# Patient Record
Sex: Female | Born: 1975 | Race: White | Hispanic: No | Smoking: Current every day smoker
Health system: Southern US, Community
[De-identification: ages and names within clinical notes are randomized; demographics above are authoritative.]

## PROBLEM LIST (undated history)

## (undated) DIAGNOSIS — I7300 Raynaud's syndrome without gangrene: Secondary | ICD-10-CM

## (undated) DIAGNOSIS — F32A Depression, unspecified: Secondary | ICD-10-CM

## (undated) DIAGNOSIS — I671 Cerebral aneurysm, nonruptured: Secondary | ICD-10-CM

## (undated) DIAGNOSIS — N3010 Interstitial cystitis (chronic) without hematuria: Secondary | ICD-10-CM

## (undated) DIAGNOSIS — M329 Systemic lupus erythematosus, unspecified: Secondary | ICD-10-CM

## (undated) DIAGNOSIS — F419 Anxiety disorder, unspecified: Secondary | ICD-10-CM

## (undated) DIAGNOSIS — F988 Other specified behavioral and emotional disorders with onset usually occurring in childhood and adolescence: Secondary | ICD-10-CM

## (undated) DIAGNOSIS — M549 Dorsalgia, unspecified: Secondary | ICD-10-CM

## (undated) DIAGNOSIS — G43909 Migraine, unspecified, not intractable, without status migrainosus: Secondary | ICD-10-CM

## (undated) DIAGNOSIS — IMO0002 Reserved for concepts with insufficient information to code with codable children: Secondary | ICD-10-CM

## (undated) DIAGNOSIS — J302 Other seasonal allergic rhinitis: Secondary | ICD-10-CM

## (undated) DIAGNOSIS — K219 Gastro-esophageal reflux disease without esophagitis: Secondary | ICD-10-CM

## (undated) DIAGNOSIS — M3500 Sicca syndrome, unspecified: Secondary | ICD-10-CM

## (undated) DIAGNOSIS — M1990 Unspecified osteoarthritis, unspecified site: Secondary | ICD-10-CM

## (undated) DIAGNOSIS — M797 Fibromyalgia: Secondary | ICD-10-CM

## (undated) DIAGNOSIS — F329 Major depressive disorder, single episode, unspecified: Secondary | ICD-10-CM

## (undated) DIAGNOSIS — I10 Essential (primary) hypertension: Secondary | ICD-10-CM

## (undated) DIAGNOSIS — N200 Calculus of kidney: Secondary | ICD-10-CM

## (undated) DIAGNOSIS — E162 Hypoglycemia, unspecified: Secondary | ICD-10-CM

## (undated) HISTORY — PX: CHOLECYSTECTOMY: SHX55

## (undated) HISTORY — DX: Anxiety disorder, unspecified: F41.9

## (undated) HISTORY — DX: Calculus of kidney: N20.0

## (undated) HISTORY — PX: TUBAL LIGATION: SHX77

## (undated) HISTORY — DX: Interstitial cystitis (chronic) without hematuria: N30.10

## (undated) HISTORY — DX: Other seasonal allergic rhinitis: J30.2

## (undated) HISTORY — DX: Reserved for concepts with insufficient information to code with codable children: IMO0002

## (undated) HISTORY — DX: Systemic lupus erythematosus, unspecified: M32.9

## (undated) HISTORY — DX: Gastro-esophageal reflux disease without esophagitis: K21.9

## (undated) HISTORY — DX: Major depressive disorder, single episode, unspecified: F32.9

## (undated) HISTORY — DX: Unspecified osteoarthritis, unspecified site: M19.90

## (undated) HISTORY — PX: TONSILLECTOMY: SUR1361

## (undated) HISTORY — DX: Cerebral aneurysm, nonruptured: I67.1

## (undated) HISTORY — DX: Depression, unspecified: F32.A

## (undated) HISTORY — DX: Raynaud's syndrome without gangrene: I73.00

## (undated) HISTORY — DX: Other specified behavioral and emotional disorders with onset usually occurring in childhood and adolescence: F98.8

## (undated) HISTORY — DX: Sjogren syndrome, unspecified: M35.00

## (undated) HISTORY — PX: APPENDECTOMY: SHX54

## (undated) HISTORY — DX: Fibromyalgia: M79.7

---

## 2011-11-08 ENCOUNTER — Encounter (HOSPITAL_BASED_OUTPATIENT_CLINIC_OR_DEPARTMENT_OTHER): Payer: Self-pay | Admitting: *Deleted

## 2011-11-08 ENCOUNTER — Emergency Department (HOSPITAL_BASED_OUTPATIENT_CLINIC_OR_DEPARTMENT_OTHER)
Admission: EM | Admit: 2011-11-08 | Discharge: 2011-11-09 | Disposition: A | Discharge status: Home / Self Care | Payer: BC Managed Care – PPO | Attending: Emergency Medicine | Admitting: Emergency Medicine

## 2011-11-08 DIAGNOSIS — R002 Palpitations: Secondary | ICD-10-CM

## 2011-11-08 DIAGNOSIS — G43909 Migraine, unspecified, not intractable, without status migrainosus: Secondary | ICD-10-CM | POA: Insufficient documentation

## 2011-11-08 DIAGNOSIS — R112 Nausea with vomiting, unspecified: Secondary | ICD-10-CM | POA: Insufficient documentation

## 2011-11-08 DIAGNOSIS — R51 Headache: Secondary | ICD-10-CM

## 2011-11-08 HISTORY — DX: Dorsalgia, unspecified: M54.9

## 2011-11-08 HISTORY — DX: Migraine, unspecified, not intractable, without status migrainosus: G43.909

## 2011-11-08 MED ORDER — ONDANSETRON 4 MG PO TBDP
4.0000 mg | ORAL_TABLET | Freq: Once | ORAL | Status: AC
  Administered 2011-11-08: 4 mg via ORAL
  Filled 2011-11-08: qty 1

## 2011-11-08 MED ORDER — HYDROMORPHONE HCL PF 2 MG/ML IJ SOLN
2.0000 mg | Freq: Once | INTRAMUSCULAR | Status: AC
  Administered 2011-11-08: 2 mg via INTRAMUSCULAR
  Filled 2011-11-08: qty 1

## 2011-11-08 MED ORDER — ONDANSETRON HCL 4 MG/2ML IJ SOLN
4.0000 mg | Freq: Once | INTRAMUSCULAR | Status: AC
  Administered 2011-11-08: 4 mg via INTRAMUSCULAR
  Filled 2011-11-08: qty 2

## 2011-11-08 MED ORDER — PROMETHAZINE HCL 25 MG PO TABS
25.0000 mg | ORAL_TABLET | Freq: Once | ORAL | Status: AC
  Administered 2011-11-08: 25 mg via ORAL
  Filled 2011-11-08: qty 1

## 2011-11-08 MED ORDER — KETOROLAC TROMETHAMINE 60 MG/2ML IM SOLN
60.0000 mg | Freq: Once | INTRAMUSCULAR | Status: AC
  Administered 2011-11-08: 60 mg via INTRAMUSCULAR
  Filled 2011-11-08: qty 2

## 2011-11-08 NOTE — ED Notes (Signed)
Pt reports sensation of heart palpitations again and pain in left leg that radiates downward with numbness in two of her toes. Pt is NSR on monitor without ectopy. Pt reassured or regular rhythm.

## 2011-11-08 NOTE — ED Notes (Signed)
Pt c/o palpitations x 1 week

## 2011-11-08 NOTE — ED Provider Notes (Signed)
Medical screening examination/treatment/procedure(s) were performed by non-physician practitioner and as supervising physician I was immediately available for consultation/collaboration.   Santiana Glidden B. Bernette Mayers, MD 11/08/11 2322

## 2011-11-08 NOTE — ED Provider Notes (Addendum)
History     CSN: [id number]  Arrival date & time 11/08/11  1933   First MD Initiated Contact with Patient 11/08/11 2018      Chief Complaint  Patient presents with  . Palpitations  . Headache  . Migraine    (Consider location/radiation/quality/duration/timing/severity/associated sxs/prior treatment) Patient is a 36 y.o. female presenting with palpitations. The history is provided by the patient. No language interpreter was used.  Palpitations  This is a new problem. The current episode started more than 1 week ago. The problem occurs constantly. The problem has been gradually worsening. The problem is associated with stress. Associated symptoms include headaches. She has tried nothing for the symptoms.   Pt reports she feels like her heart is skipping beats and beating irregularly.   Past Medical History  Diagnosis Date  . Migraine   . Back pain     Past Surgical History  Procedure Date  . Tonsillectomy   . Appendectomy   . Cholecystectomy   . Tubal ligation     History reviewed. No pertinent family history.  History  Substance Use Topics  . Smoking status: Never Smoker   . Smokeless tobacco: Not on file  . Alcohol Use: No    OB History    Grav Para Term Preterm Abortions TAB SAB Ect Mult Living                  Review of Systems  Cardiovascular: Positive for palpitations.  Neurological: Positive for headaches.  All other systems reviewed and are negative.    Allergies  Penicillins and Sulfa antibiotics  Home Medications  No current outpatient prescriptions on file.  BP 149/90  Pulse 84  Temp 98.3 F (36.8 C) (Oral)  Resp 16  Ht 5\' 1"  (1.549 m)  Wt 165 lb (74.844 kg)  BMI 31.18 kg/m2  SpO2 100%  LMP 10/31/2011  Physical Exam  Nursing note and vitals reviewed. Constitutional: She is oriented to person, place, and time. She appears well-developed and well-nourished.  HENT:  Head: Normocephalic and atraumatic.  Right Ear: External ear  normal.  Left Ear: External ear normal.  Nose: Nose normal.  Mouth/Throat: Oropharynx is clear and moist.  Eyes: Conjunctivae and EOM are normal. Pupils are equal, round, and reactive to light.  Neck: Normal range of motion. Neck supple.  Cardiovascular: Normal rate, regular rhythm and normal heart sounds.   Pulmonary/Chest: Effort normal.  Abdominal: Soft.  Musculoskeletal: Normal range of motion.  Neurological: She is alert and oriented to person, place, and time. She has normal reflexes.  Skin: Skin is warm.  Psychiatric: She has a normal mood and affect.    ED Course  Procedures (including critical care time)  Labs Reviewed - No data to display No results found.   No diagnosis found.  Date: 11/08/2011  Rate: 89  Rhythm: normal sinus rhythm  QRS Axis: normal  Intervals: normal  ST/T Wave abnormalities: normal  Conduction Disutrbances:none  Narrative Interpretation:   Old EKG Reviewed: none available Monitor no pvc's or pac's.     Pt given torodol with no relief.   Pt is driving.  I gave pt a shot of dilaudid and zofran.   Pt hyperventilating after injection.  Pt complains of numbness in left 4th and 5th toe.  Pt counseled on breathing and slowed down by EMT.    Pt reports she has had this numbness before.  I suspect symptoms due to hyperventilation.  Pt has continued nausea and vomiting.  Dr.  campos in to see and examine pt,   MDM  Pt advised to see her MD for recheck        Elson Areas, [place] 11/08/11 2321  Lonia Skinner [place], [place] 11/08/11 2323  Lonia Skinner [place], [place] 11/09/11 [phone]

## 2011-11-09 MED ORDER — DIAZEPAM 5 MG/ML IJ SOLN
5.0000 mg | Freq: Once | INTRAMUSCULAR | Status: AC
  Administered 2011-11-09: 5 mg via INTRAMUSCULAR
  Filled 2011-11-09: qty 2

## 2011-11-09 MED ORDER — ONDANSETRON HCL 4 MG/2ML IJ SOLN
4.0000 mg | Freq: Once | INTRAMUSCULAR | Status: AC
  Administered 2011-11-09: 4 mg via INTRAMUSCULAR
  Filled 2011-11-09: qty 2

## 2011-11-09 NOTE — ED Provider Notes (Signed)
Medical screening examination/treatment/procedure(s) were conducted as a shared visit with non-physician practitioner(s) and myself.  I personally evaluated the patient during the encounter  The patient has a normal neurologic exam.  She has pain medications and antinausea medicine at home.  She ambulated in the emergency apartment without difficulty.  I do not believe that she needs to have imaging of her head tonight.  She has nonspecific headache and vertiginous symptoms.  I do not believe this to be a stroke.  She has a neurologist who have asked her followup with.  Review of her telemetry strip demonstrates no ectopy or arrhythmia.  EKG normal sinus rhythm  Lyanne Co, MD 11/09/11 0120

## 2013-02-12 DIAGNOSIS — K529 Noninfective gastroenteritis and colitis, unspecified: Secondary | ICD-10-CM | POA: Insufficient documentation

## 2013-02-12 DIAGNOSIS — I10 Essential (primary) hypertension: Secondary | ICD-10-CM | POA: Insufficient documentation

## 2013-05-29 DIAGNOSIS — F339 Major depressive disorder, recurrent, unspecified: Secondary | ICD-10-CM | POA: Insufficient documentation

## 2014-02-26 HISTORY — PX: BRAIN SURGERY: SHX531

## 2014-03-15 DIAGNOSIS — I671 Cerebral aneurysm, nonruptured: Secondary | ICD-10-CM | POA: Insufficient documentation

## 2015-08-18 DIAGNOSIS — M329 Systemic lupus erythematosus, unspecified: Secondary | ICD-10-CM | POA: Insufficient documentation

## 2015-08-18 DIAGNOSIS — G894 Chronic pain syndrome: Secondary | ICD-10-CM | POA: Insufficient documentation

## 2015-08-18 DIAGNOSIS — M3500 Sicca syndrome, unspecified: Secondary | ICD-10-CM | POA: Insufficient documentation

## 2015-08-18 DIAGNOSIS — I7300 Raynaud's syndrome without gangrene: Secondary | ICD-10-CM | POA: Insufficient documentation

## 2015-11-19 DIAGNOSIS — R011 Cardiac murmur, unspecified: Secondary | ICD-10-CM

## 2015-11-19 DIAGNOSIS — M329 Systemic lupus erythematosus, unspecified: Secondary | ICD-10-CM

## 2015-11-19 DIAGNOSIS — E162 Hypoglycemia, unspecified: Secondary | ICD-10-CM

## 2015-11-19 DIAGNOSIS — I10 Essential (primary) hypertension: Secondary | ICD-10-CM

## 2015-11-19 NOTE — Congregational Nurse Program (Signed)
Congregational Nurse Program Note  Date of Encounter: 11/19/2015  Past Medical History: Past Medical History:  Diagnosis Date  . Back pain   . Migraine     Encounter Details:     CNP Questionnaire - 11/19/15 1743      Patient Demographics   Is this a new or existing patient? New   Patient is considered a/an Not Applicable   Race Caucasian/White     Patient Assistance   Location of Patient Assistance Not Applicable   Patient's financial/insurance status Medicaid   Uninsured Patient No   Patient referred to apply for the following financial assistance Not Applicable   Food insecurities addressed Not Applicable   Transportation assistance No   Assistance securing medications Yes   Type of Assistance Other   Educational health offerings Chronic disease;Navigating the healthcare system;Medications;Behavioral health;Hypertension;Interpersonal relationships     Encounter Details   Primary purpose of visit Education/Health Concerns;Spiritual Care/Support Visit;Navigating the Healthcare System;Chronic Illness/Condition Visit   Was an Emergency Department visit averted? Not Applicable   Does patient have a medical provider? No   Patient referred to Establish PCP;Area Agency   Was a mental health screening completed? (GAINS tool) No   Does patient have dental issues? Yes   Was a dental referral made? Yes   Does patient have vision issues? Yes   Was a vision referral made? Yes   Does your patient have an abnormal blood pressure today? Yes   Since previous encounter, have you referred patient for abnormal blood pressure that resulted in a new diagnosis or medication change? No   Does your patient have an abnormal blood glucose today? No   Since previous encounter, have you referred patient for abnormal blood glucose that resulted in a new diagnosis or medication change? No   Was there a life-saving intervention made? No     Initial visit  For this client [facility]  [facility]. States she has been off her blood pressure medicine  For some time now. ,since living here has no PCP , had a brain aneurysm  2015, heart murmur, hypoglycemic, Lupus,  Glaucoma, needs dental and eye exam, cyst on left ovary, menstrual cycle irregular , ,depression,  , headaches , migraines client  Nurse gave client a list of  PCP and dental , eye exam  Practices  To call for appointments . Client to  Follow up.,numbers given. Nurse will follow  up next week to see progress on making appointments.  Has been married  for  17 years , separated now  , husband living with his parents and their 40 year old son . If she gets her life together she states they will  Get back together as a family.Client  Also states she was once seen in a pain management clinic. Client has multiple  Health care concerns and needs. Follow up weekly to assure follow  through. Counseled regarding high blood  Pressure and need for  Follow  Up !!

## 2015-11-25 ENCOUNTER — Ambulatory Visit (HOSPITAL_COMMUNITY)
Admission: EM | Admit: 2015-11-25 | Discharge: 2015-11-25 | Disposition: A | Discharge status: Home / Self Care | Payer: Medicaid Other | Attending: Family Medicine | Admitting: Family Medicine

## 2015-11-25 ENCOUNTER — Encounter (HOSPITAL_COMMUNITY): Payer: Self-pay | Admitting: *Deleted

## 2015-11-25 DIAGNOSIS — Z5900 Homelessness unspecified: Secondary | ICD-10-CM

## 2015-11-25 DIAGNOSIS — I10 Essential (primary) hypertension: Secondary | ICD-10-CM

## 2015-11-25 DIAGNOSIS — J069 Acute upper respiratory infection, unspecified: Secondary | ICD-10-CM | POA: Diagnosis not present

## 2015-11-25 HISTORY — DX: Essential (primary) hypertension: I10

## 2015-11-25 MED ORDER — AZITHROMYCIN 250 MG PO TABS: ORAL_TABLET | ORAL | 0 refills | Status: DC

## 2015-11-25 MED ORDER — IPRATROPIUM BROMIDE 0.06 % NA SOLN: 2.0000 | Freq: Four times a day (QID) | NASAL | 1 refills | Status: DC

## 2015-11-25 MED ORDER — AMLODIPINE BESYLATE 10 MG PO TABS: 10.0000 mg | ORAL_TABLET | Freq: Every day | ORAL | 1 refills | Status: DC

## 2015-11-25 NOTE — Congregational Nurse Program (Signed)
Congregational Nurse Program Note  Date of Encounter: 11/25/2015  Past Medical History: Past Medical History:  Diagnosis Date  . Back pain   . Hypertension   . Migraine     Encounter Details:     CNP Questionnaire - 11/25/15 1813      Patient Demographics   Is this a new or existing patient? Existing   Patient is considered a/an Not Applicable   Race Caucasian/White     Patient Assistance   Location of Patient Assistance Not Applicable   Patient's financial/insurance status Medicaid   Uninsured Patient No   Patient referred to apply for the following financial assistance Not Applicable   Food insecurities addressed Not Applicable   Transportation assistance No   Assistance securing medications No   Educational health offerings Hypertension;Medications;Navigating the healthcare system;Behavioral health     Encounter Details   Primary purpose of visit Education/Health Concerns;Navigating the Healthcare System;Chronic Illness/Condition Visit;Spiritual Care/Support Visit   Was an Emergency Department visit averted? Not Applicable   Does patient have a medical provider? No   Patient referred to Establish PCP   Was a mental health screening completed? (GAINS tool) No   Does patient have dental issues? No   Was a dental referral made? No   Does patient have vision issues? Yes   Was a vision referral made? Yes   Does your patient have an abnormal blood pressure today? Yes   Since previous encounter, have you referred patient for abnormal blood pressure that resulted in a new diagnosis or medication change? Yes   Does your patient have an abnormal blood glucose today? No   Since previous encounter, have you referred patient for abnormal blood glucose that resulted in a new diagnosis or medication change? No   Was there a life-saving intervention made? No    Client in to see nurse  Somewhat  Frustrated  States  She  Went to  ED today  Because she  Needed her  Blood  Pressure  medication  . Client followed up with  Calling for  PCP home  But  Needs to have her  Medicaid  [place]  An appointment can be  Made . Became  Frustrated and  Went to  ED. Nurse called  [facility] to make  Sure that is the case and  Information was verified . [place]  [facility] [place]  And  Left a message for medicaid  Worked  To call nurse. Will try  To  Navigates  System for  Client. Client needs a PCP . Client is  Stressed and  Needs care States she is  Stressed because  She  doent  Feel good, grandmother passed  Away  Couldn't  Go  To the funeral , no  Family  members would  Come to pick her up. . Needs mental  Health  Counseling  Lots of relationship issues. Priority getting her a PCP , and  Mental health counseling.. Client was placed on amlodipine   10 mg , pressure came down some  From ED visit.  Will follow up  Om medicaid issue and follow  For medication compliance.

## 2015-11-25 NOTE — ED Provider Notes (Signed)
[facility]    CSN: [id number] Arrival date & time: 11/25/15  1018  First Provider Contact:  First MD Initiated Contact with Patient 11/25/15 1132        History   Chief Complaint Chief Complaint  Patient presents with  . URI    HPI Joy Nelson is a 40 y.o. female.    URI  Presenting symptoms: congestion, cough, rhinorrhea and sore throat   Presenting symptoms: no fever   Severity:  Mild Onset quality:  Gradual Duration:  2 weeks Chronicity:  Chronic Relieved by:  None tried Worsened by:  Nothing Ineffective treatments:  None tried Associated symptoms: headaches and sinus pain   Risk factors comment:  Hypertension   Past Medical History:  Diagnosis Date  . Back pain   . Hypertension   . Migraine     There are no active problems to display for this patient.   Past Surgical History:  Procedure Laterality Date  . APPENDECTOMY    . CHOLECYSTECTOMY    . TONSILLECTOMY    . TUBAL LIGATION      OB History    No data available       Home Medications    Prior to Admission medications   Medication Sig Start Date End Date Taking? Authorizing Provider  ALPRAZolam Prudy Feeler(XANAX) 1 MG tablet Take 1 mg by mouth 3 (three) times daily as needed. For anxiety.    Historical Provider, MD  amphetamine-dextroamphetamine (ADDERALL XR) 30 MG 24 hr capsule Take 30 mg by mouth every morning.    Historical Provider, MD  ARIPiprazole (ABILIFY) 5 MG tablet Take 5 mg by mouth daily.    Historical Provider, MD  Gabapentin, PHN, (GRALISE) 300 MG TABS Take 1 tablet by mouth daily.    Historical Provider, MD  hydroxychloroquine (PLAQUENIL) 200 MG tablet Take 200 mg by mouth daily.    Historical Provider, MD  meclizine (ANTIVERT) 25 MG tablet Take 25 mg by mouth 3 (three) times daily as needed. For dizziness.    Historical Provider, MD  methadone (DOLOPHINE) 10 MG tablet Take 10 mg by mouth 4 (four) times daily - after meals and at bedtime.    Historical Provider, MD    metoprolol (LOPRESSOR) 50 MG tablet Take 50 mg by mouth 2 (two) times daily.    Historical Provider, MD  mycophenolate (CELLCEPT) 500 MG tablet Take 500 mg by mouth 3 (three) times daily.    Historical Provider, MD  nortriptyline (PAMELOR) 25 MG capsule Take 25 mg by mouth at bedtime. Patient uses 75 milligrams daily.    Historical Provider, MD  oxyCODONE-acetaminophen (PERCOCET) 10-325 MG per tablet Take 1 tablet by mouth every 4 (four) hours as needed. For pain.    Historical Provider, MD  prednisoLONE 5 MG TABS Take 5 mg by mouth 2 (two) times daily.    Historical Provider, MD  tiZANidine (ZANAFLEX) 2 MG tablet Take 2 mg by mouth every 6 (six) hours as needed. For muscle spasms.    Historical Provider, MD  topiramate (TOPAMAX) 25 MG tablet Take 25 mg by mouth 2 (two) times daily.    Historical Provider, MD    Family History History reviewed. No pertinent family history.  Social History Social History  Substance Use Topics  . Smoking status: Never Smoker  . Smokeless tobacco: Not on file  . Alcohol use No     Allergies   Penicillins and Sulfa antibiotics   Review of Systems Review of Systems  Constitutional: Negative.  Negative  for fever.  HENT: Positive for congestion, rhinorrhea, sinus pressure and sore throat.   Eyes: Negative.   Respiratory: Positive for cough. Negative for shortness of breath.   Cardiovascular: Negative.   Gastrointestinal: Negative.   Neurological: Positive for headaches. Negative for dizziness and light-headedness.  All other systems reviewed and are negative.    Physical Exam Triage Vital Signs ED Triage Vitals [11/25/15 1136]  Enc Vitals Group     BP (!) 180/106     Pulse Rate 88     Resp 18     Temp 98.6 F (37 C)     Temp Source Oral     SpO2 99 %     Weight      Height      Head Circumference      Peak Flow      Pain Score      Pain Loc      Pain Edu?      Excl. in GC?    No data found.   Updated Vital Signs BP (!) 180/106  (BP Location: Left Arm)   Pulse 88   Temp 98.6 F (37 C) (Oral)   Resp 18   LMP 11/24/2015   SpO2 99%   Visual Acuity Right Eye Distance:   Left Eye Distance:   Bilateral Distance:    Right Eye Near:   Left Eye Near:    Bilateral Near:     Physical Exam  Constitutional: She is oriented to person, place, and time. She appears well-developed and well-nourished. No distress.  HENT:  Head: Normocephalic.  Right Ear: External ear normal.  Left Ear: External ear normal.  Nose: Nose normal.  Mouth/Throat: Oropharynx is clear and moist.  Eyes: Conjunctivae are normal. Pupils are equal, round, and reactive to light.  Neck: Normal range of motion. Neck supple.  Cardiovascular: Normal rate, regular rhythm, normal heart sounds and intact distal pulses.   Pulmonary/Chest: Effort normal and breath sounds normal.  Lymphadenopathy:    She has no cervical adenopathy.  Neurological: She is alert and oriented to person, place, and time.  Skin: Skin is warm and dry.  Nursing note and vitals reviewed.    UC Treatments / Results  Labs (all labs ordered are listed, but only abnormal results are displayed) Labs Reviewed - No data to display  EKG  EKG Interpretation None       Radiology No results found.  Procedures Procedures (including critical care time)  Medications Ordered in UC Medications - No data to display   Initial Impression / Assessment and Plan / UC Course  I have reviewed the triage vital signs and the nursing notes.  Pertinent labs & imaging results that were available during my care of the patient were reviewed by me and considered in my medical decision making (see chart for details).  Clinical Course      Final Clinical Impressions(s) / UC Diagnoses   Final diagnoses:  None    New Prescriptions New Prescriptions   No medications on file     Linna Hoff, MD 11/25/15 1147

## 2015-11-25 NOTE — ED Triage Notes (Signed)
Pt  Has   Symptoms  Of  Sinus  Congestion     r  Earache   bp  Is  Elevated    As   Well      Pt  Has  Been taking otc       meds   Pt  Has  No pcp in  [place]   She has  Been out of  Her meds   X  3  Weeks  She  Also has a  headache

## 2015-11-28 ENCOUNTER — Encounter (HOSPITAL_COMMUNITY): Payer: Self-pay | Admitting: Family Medicine

## 2015-11-28 ENCOUNTER — Ambulatory Visit (HOSPITAL_COMMUNITY)
Admission: EM | Admit: 2015-11-28 | Discharge: 2015-11-28 | Disposition: A | Discharge status: Home / Self Care | Payer: Medicaid Other | Attending: Internal Medicine | Admitting: Internal Medicine

## 2015-11-28 DIAGNOSIS — R600 Localized edema: Secondary | ICD-10-CM

## 2015-11-28 DIAGNOSIS — I10 Essential (primary) hypertension: Secondary | ICD-10-CM | POA: Diagnosis not present

## 2015-11-28 DIAGNOSIS — T887XXA Unspecified adverse effect of drug or medicament, initial encounter: Secondary | ICD-10-CM | POA: Diagnosis not present

## 2015-11-28 DIAGNOSIS — T50905A Adverse effect of unspecified drugs, medicaments and biological substances, initial encounter: Secondary | ICD-10-CM

## 2015-11-28 LAB — POCT I-STAT, CHEM 8
BUN: 12 mg/dL (ref 6–20)
Calcium, Ion: 1.13 mmol/L — ABNORMAL LOW (ref 1.15–1.40)
Chloride: 104 mmol/L (ref 101–111)
Creatinine, Ser: 0.5 mg/dL (ref 0.44–1.00)
Glucose, Bld: 85 mg/dL (ref 65–99)
HCT: 49 % — ABNORMAL HIGH (ref 36.0–46.0)
Hemoglobin: 16.7 g/dL — ABNORMAL HIGH (ref 12.0–15.0)
POTASSIUM: 3.4 mmol/L — AB (ref 3.5–5.1)
Sodium: 140 mmol/L (ref 135–145)
TCO2: 23 mmol/L (ref 0–100)

## 2015-11-28 MED ORDER — LISINOPRIL 10 MG PO TABS: 10.0000 mg | ORAL_TABLET | Freq: Every day | ORAL | 0 refills | Status: DC

## 2015-11-28 MED ORDER — CLOTRIMAZOLE 1 % EX CREA: TOPICAL_CREAM | CUTANEOUS | 0 refills | Status: DC

## 2015-11-28 NOTE — ED Triage Notes (Signed)
Pt here. sts that she doesn't feel right. sts she has been taking abx for sinus infection. sts the infection feels better. sts her BP medicine was switched last time she was here and has been having headaches, fatigue and BLE swelling.

## 2015-11-28 NOTE — ED Provider Notes (Signed)
CSN: [id number]     Arrival date & time 11/28/15  1051 History   None    Chief Complaint  Patient presents with  . Headache  . Leg Swelling   (Consider location/radiation/quality/duration/timing/severity/associated sxs/prior Treatment) Joy Nelson is a 40 yo female who carries a history of HTN. She presents with swelling in her lower extremities and not feeling well since initiation of the Norvasc 3 days ago. No dyspnea or chest pain is noted. No fevers. Sinus infection appears improved.       Past Medical History:  Diagnosis Date  . Back pain   . Hypertension   . Migraine    Past Surgical History:  Procedure Laterality Date  . APPENDECTOMY    . CHOLECYSTECTOMY    . TONSILLECTOMY    . TUBAL LIGATION     History reviewed. No pertinent family history. Social History  Substance Use Topics  . Smoking status: Never Smoker  . Smokeless tobacco: Never Used  . Alcohol use No   OB History    No data available     Review of Systems  Constitutional: Positive for fatigue. Negative for fever.  Eyes: Negative for photophobia, pain and visual disturbance.  Respiratory: Negative for cough and shortness of breath.   Cardiovascular: Positive for leg swelling. Negative for chest pain.  Gastrointestinal: Negative for nausea.  Skin: Negative.   Neurological: Positive for headaches. Negative for dizziness, weakness and numbness.  Psychiatric/Behavioral: Negative.     Allergies  Methotrexate derivatives; Other; Penicillins; and Sulfa antibiotics  Home Medications   Prior to Admission medications   Medication Sig Start Date End Date Taking? Authorizing Provider  azithromycin (ZITHROMAX Z-PAK) 250 MG tablet Take as directed on pack 11/25/15  Yes Linna Hoff, MD  ipratropium (ATROVENT) 0.06 % nasal spray Place 2 sprays into both nostrils 4 (four) times daily. 11/25/15  Yes Linna Hoff, MD  lisinopril (PRINIVIL,ZESTRIL) 10 MG tablet Take 1 tablet (10 mg total) by mouth daily. 11/28/15    Riki Sheer, PA-C   Meds Ordered and Administered this Visit  Medications - No data to display  BP 156/87 (BP Location: Left Arm)   Pulse 112   Temp 98.7 F (37.1 C) (Oral)   Resp 16   LMP 11/24/2015   SpO2 100%  No data found.   Physical Exam  Constitutional: She is oriented to person, place, and time. She appears well-developed and well-nourished. No distress.  HENT:  Head: Normocephalic and atraumatic.  Eyes: EOM are normal. Pupils are equal, round, and reactive to light.  Neck: Normal range of motion. Neck supple.  No JVD  Cardiovascular: Normal rate and regular rhythm.   Bilateral 2+ LE edema  Pulmonary/Chest: Effort normal and breath sounds normal.  Musculoskeletal: She exhibits edema.  Neurological: She is alert and oriented to person, place, and time.  Skin: Skin is warm and dry. She is not diaphoretic. No erythema.  Psychiatric: Her behavior is normal.  Nursing note and vitals reviewed.   Urgent Care Course   Clinical Course    Procedures (including critical care time)  Labs Review Labs Reviewed  POCT I-STAT, CHEM 8 - Abnormal; Notable for the following:       Result Value   Potassium 3.4 (*)    Calcium, Ion 1.13 (*)    Hemoglobin 16.7 (*)    HCT 49.0 (*)    All other components within normal limits    Imaging Review No results found.   Visual Acuity Review  Right Eye Distance:   Left Eye Distance:   Bilateral Distance:    Right Eye Near:   Left Eye Near:    Bilateral Near:         MDM   1. Essential hypertension   2. Drug reaction, initial encounter   3. Bilateral edema of lower extremity    1. Stop Norvasc in the setting of edema. Ok to start Lisinopril (normal Scr) but must f/u with PCP for follow up and any further refills. Avoid NSAIDs. Mild tachycardia on EKG, down to 90 with my exam. Failed BB in the past, discuss with PCP.  2/3 As above    Riki SheerMichelle G Dashley Monts, PA-C 11/28/15 1345

## 2015-11-28 NOTE — Discharge Instructions (Signed)
You had a reaction to Norvasc, will discontinue this medication. Starting Lisinopril 20mg  daily to take this place. Certainly if you continue to feel poorly then go to the ED. Otherwise avoid Aleve or ADvil as these medications can worsen you BP. Keep legs elevated to help with resolution of the edema. F/U with your PCP

## 2015-12-02 ENCOUNTER — Emergency Department (HOSPITAL_COMMUNITY): Payer: Medicaid Other

## 2015-12-02 ENCOUNTER — Encounter (HOSPITAL_COMMUNITY): Payer: Self-pay | Admitting: *Deleted

## 2015-12-02 ENCOUNTER — Emergency Department (HOSPITAL_COMMUNITY)
Admission: EM | Admit: 2015-12-02 | Discharge: 2015-12-03 | Disposition: A | Discharge status: Home / Self Care | Payer: Medicaid Other | Attending: Emergency Medicine | Admitting: Emergency Medicine

## 2015-12-02 DIAGNOSIS — Z5900 Homelessness unspecified: Secondary | ICD-10-CM

## 2015-12-02 DIAGNOSIS — G43009 Migraine without aura, not intractable, without status migrainosus: Secondary | ICD-10-CM | POA: Diagnosis not present

## 2015-12-02 DIAGNOSIS — I10 Essential (primary) hypertension: Secondary | ICD-10-CM | POA: Insufficient documentation

## 2015-12-02 DIAGNOSIS — R51 Headache: Secondary | ICD-10-CM | POA: Diagnosis present

## 2015-12-02 DIAGNOSIS — G43019 Migraine without aura, intractable, without status migrainosus: Secondary | ICD-10-CM

## 2015-12-02 MED ORDER — DIPHENHYDRAMINE HCL 50 MG/ML IJ SOLN
50.0000 mg | Freq: Once | INTRAMUSCULAR | Status: AC
  Administered 2015-12-03: 50 mg via INTRAVENOUS
  Filled 2015-12-02: qty 1

## 2015-12-02 MED ORDER — DEXAMETHASONE SODIUM PHOSPHATE 10 MG/ML IJ SOLN
10.0000 mg | Freq: Once | INTRAMUSCULAR | Status: AC
  Administered 2015-12-03: 10 mg via INTRAVENOUS
  Filled 2015-12-02: qty 1

## 2015-12-02 MED ORDER — KETOROLAC TROMETHAMINE 30 MG/ML IJ SOLN
30.0000 mg | Freq: Once | INTRAMUSCULAR | Status: AC
  Administered 2015-12-03: 30 mg via INTRAVENOUS
  Filled 2015-12-02: qty 1

## 2015-12-02 MED ORDER — METOCLOPRAMIDE HCL 5 MG/ML IJ SOLN
10.0000 mg | Freq: Once | INTRAMUSCULAR | Status: AC
  Administered 2015-12-03: 10 mg via INTRAVENOUS
  Filled 2015-12-02: qty 2

## 2015-12-02 MED ORDER — SODIUM CHLORIDE 0.9 % IV BOLUS (SEPSIS)
1000.0000 mL | Freq: Once | INTRAVENOUS | Status: AC
  Administered 2015-12-03: 1000 mL via INTRAVENOUS

## 2015-12-02 NOTE — ED Triage Notes (Addendum)
Pt states that she has continued to have a headache with numbness and difficulty focusing. Pt reports that he BP and HR have continued to be high as well. Pt alert and oriented in triage. No neuro deficits noted.

## 2015-12-02 NOTE — ED Provider Notes (Signed)
TIME SEEN: 11:40 PM  CHIEF COMPLAINT: Headache  HPI: Joy Nelson is a 40 y.o. female with a medical hx of HTN who presents to the Emergency Department complaining of gradual onset, gradually worsening, throbbing bilateral frontal HA onset 1 week. Pt notes that she was evaluated [facility] on 11/28/15 for a HA, elevated BP and she was started"on Norvasc due to elevated blood pressure. Pt states that she had an "allergic reaction" to the Norvasc (leg swelling) and was switched to lisinopril. She reports that her HA is worsened with sounds, lights and denies any alleviating factors. She notes that this HA is similar to HA that she has had in the past. She states that when she gets HA, fioricet, and a "migraine cocktail" works best to alleviate her HA. 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 Pt reports that she has an appointment with a PCP in 3 days to development adequate follow up with specialists. Pt is having associated symptoms of numbness across frontal head and of the right eye which is chronic for her when she has these headaches. Has had nausea but no vomiting. She denies fever, chills, any other numbness or focal weakness, head injury. Not on anticoagulation, antiplatelets.   ROS: See HPI Constitutional: no fever  Eyes: no drainage  ENT: no runny nose   Cardiovascular:  no chest pain  Resp: no SOB  GI: no vomiting GU: no dysuria Integumentary: no rash  Allergy: no hives  Musculoskeletal: no leg swelling  Neurological: no slurred speech ROS otherwise negative  PAST MEDICAL HISTORY/PAST SURGICAL HISTORY:  Past Medical History:  Diagnosis Date  . Back pain   . Hypertension   . Migraine     MEDICATIONS:  Prior to Admission medications   Medication Sig Start Date End Date Taking? Authorizing Provider  azithromycin (ZITHROMAX Z-PAK) 250 MG tablet Take as  directed on pack 11/25/15   Linna HoffJames D Kindl, MD  ipratropium (ATROVENT) 0.06 % nasal spray Place 2 sprays into both nostrils 4 (four) times daily. 11/25/15   Linna HoffJames D Kindl, MD  lisinopril (PRINIVIL,ZESTRIL) 10 MG tablet Take 1 tablet (10 mg total) by mouth daily. 11/28/15   Riki SheerMichelle G Young, PA-C    ALLERGIES:  Allergies  Allergen Reactions  . Methotrexate Derivatives   . Morphine And Related Itching  . Other     imatrex   . Penicillins Other (See Comments)    Childhood reaction.  Turns blue and has convulsions.  . Sulfa Antibiotics Swelling    SOCIAL HISTORY:  Social History  Substance Use Topics  . Smoking status: Never Smoker  . Smokeless tobacco: Never Used  . Alcohol use No    FAMILY HISTORY: No family history on file.  EXAM: BP (!) 155/102   Pulse 105   Temp 98.2 F (36.8 C)   Resp 16   LMP 11/24/2015   SpO2 100%  CONSTITUTIONAL: Alert and oriented and responds appropriately to questions. Well-appearing; well-nourished. afebrile and non-toxic.  HEAD: Normocephalic EYES: Conjunctivae clear, PERRL. EOMI. +photophobia.  ENT: normal nose; no rhinorrhea; moist mucous membranes NECK: Supple, no meningismus, no LAD  CARD: RRR; S1 and S2 appreciated; no murmurs, no clicks, no rubs, no gallops RESP: Normal chest excursion without splinting or tachypnea; breath sounds clear and equal bilaterally; no wheezes, no rhonchi, no rales, no hypoxia or respiratory distress, speaking full sentences ABD/GI: Normal bowel  sounds; non-distended; soft, non-tender, no rebound, no guarding, no peritoneal signs BACK:  The back appears normal and is non-tender to palpation, there is no CVA tenderness EXT: Normal ROM in all joints; non-tender to palpation; no edema; normal capillary refill; no cyanosis, no calf tenderness or swelling    SKIN: Normal color for age and race; warm; no rash NEURO: Moves all extremities equally, subjective numbness diffusely across forehead but otherwise sensation in  face and extremities is nl. Cranial nerves II through XII intact. No pronator drift.  PSYCH: The patient's mood and manner are appropriate. Grooming and personal hygiene are appropriate.  MEDICAL DECISION MAKING: Patient here with complaints of a migraine headache. Has had similar headaches many times in the past. Headache was gradual onset, moderate in nature. No thunderclap headache, sudden onset, worst headache of her life. She reports numbness across her forehead diffusely which is normal for her with her migraines and behind the right eye. No other focal neurologic deficits on exam. Reports headaches improve with migraine cocktails. Head CT ordered in triage shows pipeline stent in place with no blood. I have very low suspicion that this is a subarachnoid hemorrhage at this time I do not feel she needs a lumbar puncture. Also low suspicion for infectious etiology.  ED PROGRESS: 2:00 AM  Pt reports her headache is almost completely resolved after Toradol, Reglan, Benadryl, IV fluids and Decadron. She is requesting a prescription for Fioricet. States she has appointment to see a PCP on Friday. She is currently living in a shelter but states she did get approval for housing recently. We'll give her outpatient neurology follow-up information. Discussed return precautions. She verbalized understanding and is comfortable with this plan.   I personally performed the services described in this documentation, which was scribed in my presence. The recorded information has been reviewed and is accurate.     Layla Maw Mirjana Tarleton, DO 12/03/15 [phone]

## 2015-12-02 NOTE — ED Notes (Signed)
Pt came to nurse first to report right eye feels numb. Called CT to expedite scan.

## 2015-12-03 DIAGNOSIS — G43019 Migraine without aura, intractable, without status migrainosus: Secondary | ICD-10-CM

## 2015-12-03 DIAGNOSIS — I10 Essential (primary) hypertension: Secondary | ICD-10-CM

## 2015-12-03 MED ORDER — BUTALBITAL-APAP-CAFFEINE 50-325-40 MG PO TABS: 1.0000 | ORAL_TABLET | Freq: Four times a day (QID) | ORAL | 0 refills | Status: DC | PRN

## 2015-12-03 NOTE — Congregational Nurse Program (Signed)
Congregational Nurse Program Note  Date of Encounter: 12/02/2015  Past Medical History: Past Medical History:  Diagnosis Date  . Back pain   . Hypertension   . Migraine     Encounter Details:     CNP Questionnaire - 12/03/15 1925      Patient Demographics   Is this a new or existing patient? Existing   Patient is considered a/an Not Applicable   Race Caucasian/White     Patient Assistance   Location of Patient Assistance Not Applicable   Patient's financial/insurance status Medicaid   Uninsured Patient No   Patient referred to apply for the following financial assistance Not Applicable   Food insecurities addressed Not Applicable   Transportation assistance Yes   Type of Assistance Bus Pass Given   Assistance securing medications No   Educational health offerings Behavioral health;Navigating the healthcare system;Acute disease;Safety     Encounter Details   Primary purpose of visit Acute Illness/Condition Visit;Education/Health Concerns;Navigating the Healthcare System;Spiritual Care/Support Visit   Was an Emergency Department visit averted? No   Does patient have a medical provider? No   Patient referred to Area Agency;Establish PCP;Private Practice;Urgent Care   Was a mental health screening completed? (GAINS tool) No   Does patient have dental issues? No   Was a dental referral made? No   Does patient have vision issues? Yes   Was a vision referral made? Yes   Does your patient have an abnormal blood pressure today? Yes   Since previous encounter, have you referred patient for abnormal blood pressure that resulted in a new diagnosis or medication change? Yes   Does your patient have an abnormal blood glucose today? No   Since previous encounter, have you referred patient for abnormal blood glucose that resulted in a new diagnosis or medication change? No   Was there a life-saving intervention made? No     Client in to see nurse states she was seen  At  Urgent care  today  Told them her head was really hurting but  Was not  Treated ,feels she needs to  Go to  ED now . Can't take it ,states  I just  don't  Feel good  ,on a scale 0-10 ,with 10 being the highest she states her pain is 11. Nurse ask if she could  Take the bus  ,she states maybe but  May  Ask her roommate to  Take her, bus passes given ,states she  Knows  What she needs for her headache, has been given the  Drug  Before and it  Relieves her pain. Nurse was supportive  Of  Client and feels her heart  Rate and anxiety levels are very high, cant seem to calm down. Counseled  Regarding blood pressure elevations, stress level and need to slow  Down . Feels  Client is  Trying to  Work  Through many  Problems  And  becomes totally stressed out. Ssffers from depression . Client to  Go to  ED return to see nurse on tomorrow

## 2015-12-03 NOTE — Congregational Nurse Program (Signed)
Congregational Nurse Program Note  Date of Encounter: 12/03/2015  Past Medical History: Past Medical History:  Diagnosis Date  . Back pain   . Hypertension   . Migraine     Encounter Details:     CNP Questionnaire - 12/03/15 1911      Patient Demographics   Is this a new or existing patient? Existing   Patient is considered a/an Not Applicable   Race Caucasian/White     Patient Assistance   Location of Patient Assistance Not Applicable   Patient's financial/insurance status Medicaid   Uninsured Patient No   Patient referred to apply for the following financial assistance Not Applicable   Food insecurities addressed Not Applicable   Transportation assistance Yes   Type of Assistance Bus Pass Given   Assistance securing medications No   Educational health offerings Chronic disease;Acute disease;Medications;Navigating the healthcare system     Encounter Details   Primary purpose of visit Acute Illness/Condition Visit;Education/Health Concerns;Navigating the Healthcare System;Spiritual Care/Support Visit   Was an Emergency Department visit averted? No   Does patient have a medical provider? No   Patient referred to Establish PCP;Private Practice;Urgent Care   Was a mental health screening completed? (GAINS tool) No   Does patient have dental issues? Yes   Was a dental referral made? Yes   Does patient have vision issues? Yes   Was a vision referral made? Yes   Does your patient have an abnormal blood pressure today? Yes   Since previous encounter, have you referred patient for abnormal blood pressure that resulted in a new diagnosis or medication change? Yes   Does your patient have an abnormal blood glucose today? No   Since previous encounter, have you referred patient for abnormal blood glucose that resulted in a new diagnosis or medication change? No   Was there a life-saving intervention made? No     Client seen pass her  ED visit on yesterday for migraine   Headache,states on yesterday pain was really bad.. Now has a sinus infection . Called  [facility]  But  MD is out on leave not  TAKING NEW PATIENTS.Gave client several more offices to  Call that will accept her medicaid, for  Eye appointment as she states she has a history of  Glaucoma. Needs glasses and contacts. Client is  Feeling  Somewhat better today after her decadron  . Hoping  Medicaid  Card  Card  Comes so she can keep appointment to see PCP on Friday.Gave  Support . Hoping to  Get  Her medical issues under control and treatment for depression. Client has some leads on housing.

## 2015-12-09 DIAGNOSIS — Z5900 Homelessness unspecified: Secondary | ICD-10-CM

## 2015-12-09 DIAGNOSIS — E162 Hypoglycemia, unspecified: Secondary | ICD-10-CM

## 2015-12-09 DIAGNOSIS — J321 Chronic frontal sinusitis: Secondary | ICD-10-CM

## 2015-12-09 DIAGNOSIS — I10 Essential (primary) hypertension: Secondary | ICD-10-CM

## 2015-12-09 NOTE — Congregational Nurse Program (Signed)
Congregational Nurse Program Note  Date of Encounter: 12/09/2015  Past Medical History: Past Medical History:  Diagnosis Date  . Back pain   . Hypertension   . Migraine     Encounter Details:     CNP Questionnaire - 12/09/15 2240      Patient Demographics   Is this a new or existing patient? Existing   Race Caucasian/White     Patient Assistance   Location of Patient Assistance Not Applicable   Patient's financial/insurance status Medicaid   Uninsured Patient No   Patient referred to apply for the following financial assistance Not Applicable   Food insecurities addressed Not Applicable   Transportation assistance No   Assistance securing medications No   Educational health offerings Navigating the healthcare system;Acute disease;Chronic disease;Hypertension     Encounter Details   Primary purpose of visit Education/Health Concerns;Spiritual Care/Support Visit   Does patient have a medical provider? Yes   Patient referred to Follow up with established PCP   Does patient have dental issues? Yes   Was a dental referral made? Yes   Does patient have vision issues? Yes   Does your patient have an abnormal blood pressure today? Yes   Since previous encounter, have you referred patient for abnormal blood pressure that resulted in a new diagnosis or medication change? Yes   Does your patient have an abnormal blood glucose today? No   Since previous encounter, have you referred patient for abnormal blood glucose that resulted in a new diagnosis or medication change? No   Was there a life-saving intervention made? No     Client followed through and was seen at  [facility]  Was pleased with care ,was put  Back on all her previous  Medications but  Cant take  Neurontin and Cymbalta she states.Blood work done n was referred to a nephrologist blood in her urine , thyroid  Panel mdone. Feels  Somewhat better but  May  Have  asinus  Infection today  And may  Need  to  Return to  Her  PCP  For medication, encouraged to follow up  With him. Was pleased that  Mental health services were next  Door  And  Available to  Her. Client also  Followed through and was seen by opthalmologist  ,Dr Dione Booze  And was happy  With that  Referral , to follow  Up and get her dental appointment. Nurse commended her on her follow  Thorough. She has some  Temporary contacts will be getting her glasses. Eyes were dilated. Received her  Medicaid  Card and was able to use it . Nurse had also called [place]  [facility] to get clarification on new card date,left a message. Taking  prednisone   And  Zyrtec for allergies but  Still sinus tight ,some fever ,chills .,mask given cautioned  Regarding good hand washing.  Blood  Pressure down from last week ,medication seems to be working.Marland Kitchen Marland KitchenAlso  Counseled  Client regarding  Diet  And low  Blood  Sugars , given sugar  Tablets  And counseled on use.  States her grandmother died a couple of weeks ago and this week her uncle came and gave her $2,000.00 from her grandmother, what a blessing. She gave her husband $1,000 to keep  For her and she will use the  Rest to take care of things she needs to  Do and get her glasses. Cautioned her to use  Funds wisely .  Follow  Weekly.

## 2015-12-10 DIAGNOSIS — Z5900 Homelessness unspecified: Secondary | ICD-10-CM

## 2015-12-10 NOTE — Congregational Nurse Program (Signed)
Congregational Nurse Program Note  Date of Encounter: 12/10/2015  Past Medical History: Past Medical History:  Diagnosis Date  . Back pain   . Hypertension   . Migraine     Encounter Details:     CNP Questionnaire - 12/10/15 1805      Patient Demographics   Is this a new or existing patient? Existing   Patient is considered a/an Not Applicable   Race Caucasian/White     Patient Assistance   Location of Patient Assistance Not Applicable   Patient's financial/insurance status Medicaid   Uninsured Patient No   Patient referred to apply for the following financial assistance Not Applicable   Food insecurities addressed Not Applicable   Transportation assistance No   Assistance securing medications No   Educational health offerings Not Applicable     Encounter Details   Primary purpose of visit Other   Was an Emergency Department visit averted? Not Applicable   Does patient have a medical provider? Yes   Patient referred to Not Applicable   Was a mental health screening completed? (GAINS tool) No   Does patient have dental issues? No   Was a dental referral made? No   Does patient have vision issues? No   Was a vision referral made? No   Does your patient have an abnormal blood pressure today? No   Since previous encounter, have you referred patient for abnormal blood pressure that resulted in a new diagnosis or medication change? No   Does your patient have an abnormal blood glucose today? No   Since previous encounter, have you referred patient for abnormal blood glucose that resulted in a new diagnosis or medication change? No   Was there a life-saving intervention made? No    Spoke  Briefly  With case management client was picked  Up today for  Probation violation . Not  At center .

## 2015-12-16 DIAGNOSIS — Z5900 Homelessness unspecified: Secondary | ICD-10-CM

## 2015-12-16 DIAGNOSIS — J321 Chronic frontal sinusitis: Secondary | ICD-10-CM

## 2015-12-16 DIAGNOSIS — I10 Essential (primary) hypertension: Secondary | ICD-10-CM

## 2015-12-16 NOTE — Congregational Nurse Program (Signed)
Congregational Nurse Program Note  Date of Encounter: 12/16/2015  Past Medical History: Past Medical History:  Diagnosis Date  . Back pain   . Hypertension   . Migraine     Encounter Details:     CNP Questionnaire - 12/16/15 1736      Patient Demographics   Is this a new or existing patient? Existing   Patient is considered a/an Not Applicable   Race Caucasian/White     Patient Assistance   Location of Patient Assistance Not Applicable   Patient's financial/insurance status Medicaid   Uninsured Patient No   Patient referred to apply for the following financial assistance Not Applicable   Food insecurities addressed Not Applicable   Transportation assistance No   Assistance securing medications No   Educational health offerings Medications     Encounter Details   Primary purpose of visit Acute Illness/Condition Visit;Spiritual Care/Support Visit   Was an Emergency Department visit averted? Not Applicable   Does patient have a medical provider? Yes   Patient referred to Follow up with established PCP   Was a mental health screening completed? (GAINS tool) No   Does patient have dental issues? No   Was a dental referral made? No   Does patient have vision issues? No   Was a vision referral made? No   Does your patient have an abnormal blood pressure today? No   Since previous encounter, have you referred patient for abnormal blood pressure that resulted in a new diagnosis or medication change? No   Does your patient have an abnormal blood glucose today? No   Since previous encounter, have you referred patient for abnormal blood glucose that resulted in a new diagnosis or medication change? No   Was there a life-saving intervention made? No     B rief  Encounter with  Client [facility] . Client had been to see her PCP  And was treated for  Her sinus  Infection , very  Nasal today .  States blood pressure wa sup  Somewhat  Due to  Sinus  Infection. Will start her  medication and see nurse on tomorrow.

## 2015-12-17 DIAGNOSIS — Z5900 Homelessness unspecified: Secondary | ICD-10-CM

## 2015-12-17 DIAGNOSIS — T50905A Adverse effect of unspecified drugs, medicaments and biological substances, initial encounter: Secondary | ICD-10-CM

## 2015-12-17 DIAGNOSIS — I10 Essential (primary) hypertension: Secondary | ICD-10-CM

## 2015-12-17 DIAGNOSIS — J321 Chronic frontal sinusitis: Secondary | ICD-10-CM

## 2015-12-17 NOTE — Congregational Nurse Program (Signed)
Congregational Nurse Program Note  Date of Encounter: 12/17/2015  Past Medical History: Past Medical History:  Diagnosis Date  . Back pain   . Hypertension   . Migraine     Encounter Details:     CNP Questionnaire - 12/17/15 1854      Patient Demographics   Is this a new or existing patient? Existing   Race Caucasian/White     Patient Assistance   Location of Patient Assistance Not Applicable   Patient's financial/insurance status Medicaid   Uninsured Patient No   Patient referred to apply for the following financial assistance Not Applicable   Food insecurities addressed Not Applicable   Transportation assistance No   Assistance securing medications No   Educational health offerings Medications;Chronic disease;Hypertension;Safety     Encounter Details   Primary purpose of visit Education/Health Concerns;Spiritual Care/Support Visit;Chronic Illness/Condition Visit   Was an Emergency Department visit averted? Yes   Does patient have a medical provider? Yes   Patient referred to Follow up with established PCP;Urgent Care   Was a mental health screening completed? (GAINS tool) No   Does patient have dental issues? No   Does patient have vision issues? No   Was a vision referral made? No   Does your patient have an abnormal blood pressure today? Yes   Since previous encounter, have you referred patient for abnormal blood pressure that resulted in a new diagnosis or medication change? Yes   Does your patient have an abnormal blood glucose today? No   Since previous encounter, have you referred patient for abnormal blood glucose that resulted in a new diagnosis or medication change? No   Was there a life-saving intervention made? No    Client in pass taking 1 days  Dose of  Antibiotics and  prednisone  For her sinus  Infection. Took all  5 prednisone  Pills  At one time around  12 noon  And is  Now  Feeling a little  Anxious  And her pulse  And  Heart rate  Are up. Tc to   [facility]  To  Talk with  pharmacist  Regarding  Side  effects of  prednisone and stated if  She took all mof them  Together she  May  Be  Having  These symptoms n, nurse  Counseled  And agreed  To take  Blood  Pressure in 1 hour  Reassess client . Blood  Pressure  Was taken again  At 6:40 pm  179/103, pulse  Rate  Down slightly   p-103  States  She  isn't  As anxious  Now ,heart not  Pounding  As hard,no  Pain , counseled  Regarding  Chest  Pain , tightness ,signs  Symptoms  Of  Heart attack, to call 911. Client aware of her feelings  And  Agreed  She  Will call if she  Needs to .  Client  Didn't take the  Singular  .  Follow  Up  Next week . Encouraged to  Check with  DSS so  She  Can make her dental appointmenrt able to  Make  Other appointments.

## 2015-12-19 ENCOUNTER — Encounter: Payer: Self-pay | Admitting: Diagnostic Neuroimaging

## 2015-12-19 ENCOUNTER — Ambulatory Visit (INDEPENDENT_AMBULATORY_CARE_PROVIDER_SITE_OTHER): Payer: Medicaid Other | Admitting: Diagnostic Neuroimaging

## 2015-12-19 VITALS — BP 154/91 | HR 112 | Ht 61.0 in | Wt 165.0 lb

## 2015-12-19 DIAGNOSIS — I671 Cerebral aneurysm, nonruptured: Secondary | ICD-10-CM | POA: Diagnosis not present

## 2015-12-19 DIAGNOSIS — G43109 Migraine with aura, not intractable, without status migrainosus: Secondary | ICD-10-CM | POA: Insufficient documentation

## 2015-12-19 MED ORDER — PROPRANOLOL HCL ER 60 MG PO CP24: 60.0000 mg | ORAL_CAPSULE | Freq: Every day | ORAL | 12 refills | Status: DC

## 2015-12-19 MED ORDER — TOPIRAMATE 50 MG PO TABS: 50.0000 mg | ORAL_TABLET | Freq: Two times a day (BID) | ORAL | 4 refills | Status: DC
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
## 2015-12-19 NOTE — Patient Instructions (Addendum)
Thank you for coming to see [place] at [facility]. I hope we have been able to provide you high quality care today.  You may receive a patient satisfaction survey over the next few weeks. We would appreciate your feedback and comments so that we may continue to improve ourselves and the health of our patients.  MIGRAINE TREATMENT - stop topiramate (due to current kidney stones) - start propranolol ER 59m daily for migraine prevention - use ibuprofen and tylenol as needed for breakthrough headaches - holistic headache treatments and healthy lifestyle strategies reviewed - I do not recommend fioricet for treatment of migraines - I do not recommend reglan for treatment of nausea - To prevent or relieve headaches, try the following:  Cool Compress. Lie down and place a cool compress on your head.   Avoid headache triggers. If certain foods or odors seem to have triggered your migraines in the past, avoid them. A headache diary might help you identify triggers.   Include physical activity in your daily routine.   Manage stress. Find healthy ways to cope with the stressors, such as delegating tasks on your to-do list.   Practice relaxation techniques. Try deep breathing, yoga, massage and visualization.   Eat regularly. Eating regularly scheduled meals and maintaining a healthy diet might help prevent headaches. Also, drink plenty of fluids.   Follow a regular sleep schedule. Sleep deprivation might contribute to headaches  Consider biofeedback. With this mind-body technique, you learn to control certain bodily functions - such as muscle tension, heart rate and blood pressure - to prevent headaches or reduce headache pain.   TARDIVE DYSKINESIA (facial grimacing) - possibly related to prior reglan and abilify   UNRUPTURED RIGHT ICA ANEURYSM (S/P PIPELINE STENT) - repeat CTA head for now; may need follow up with endovascular neurosurgery/neuroradiology in future -  follow up BP treatment with PCP    ~~~~~~~~~~~~~~~~~~~~~~~~~~~~~~~~~~~~~~~~~~~~~~~~~~~~~~~~~~~~~~~~~  DR. PENUMALLI'S GUIDE TO HAPPY AND HEALTHY LIVING These are some of my general health and wellness recommendations. Some of them may apply to you better than others. Please use common sense as you try these suggestions and feel free to ask me any questions.   ACTIVITY/FITNESS Mental, social, emotional and physical stimulation are very important for brain and body health. Try learning a new activity (arts, music, language, sports, games).  Keep moving your body to the best of your abilities. You can do this at home, inside or outside, the park, community center, gym or anywhere you like. Consider a physical therapist or personal trainer to get started. Consider the app Sworkit. Fitness trackers such as smart-watches, smart-phones or Fitbits can help as well.   NUTRITION Eat more plants: colorful vegetables, nuts, seeds and berries.  Eat less sugar, salt, preservatives and processed foods.  Avoid toxins such as cigarettes and alcohol.  Drink water when you are thirsty. Warm water with a slice of lemon is an excellent morning drink to start the day.  Consider these websites for more information The Nutrition Source (hhttps://www.henry-hernandez.biz/ Precision Nutrition (wWindowBlog.ch   RELAXATION Consider practicing mindfulness meditation or other relaxation techniques such as deep breathing, prayer, yoga, tai chi, massage. See website mindful.org or the apps Headspace or Calm to help get started.   SLEEP Try to get at least 7-8+ hours sleep per day. Regular exercise and reduced caffeine will help you sleep better. Practice good sleep hygeine techniques. See website sleep.org for more information.   PLANNING Prepare estate planning, living will, healthcare POA documents. Sometimes this  is best planned with the help of an attorney.  Theconversationproject.org and agingwithdignity.org are excellent resources.

## 2015-12-22 ENCOUNTER — Other Ambulatory Visit: Payer: Self-pay | Admitting: Diagnostic Neuroimaging

## 2015-12-24 ENCOUNTER — Ambulatory Visit
Admission: RE | Admit: 2015-12-24 | Discharge: 2015-12-24 | Disposition: A | Discharge status: Home / Self Care | Payer: Medicaid Other | Source: Ambulatory Visit | Attending: Diagnostic Neuroimaging | Admitting: Diagnostic Neuroimaging

## 2015-12-24 DIAGNOSIS — I671 Cerebral aneurysm, nonruptured: Secondary | ICD-10-CM

## 2015-12-24 DIAGNOSIS — Z5900 Homelessness unspecified: Secondary | ICD-10-CM

## 2015-12-24 DIAGNOSIS — F329 Major depressive disorder, single episode, unspecified: Secondary | ICD-10-CM

## 2015-12-24 DIAGNOSIS — F419 Anxiety disorder, unspecified: Secondary | ICD-10-CM

## 2015-12-24 DIAGNOSIS — I10 Essential (primary) hypertension: Secondary | ICD-10-CM

## 2015-12-24 MED ORDER — IOPAMIDOL (ISOVUE-370) INJECTION 76%
80.0000 mL | Freq: Once | INTRAVENOUS | Status: AC | PRN
  Administered 2015-12-24: 80 mL via INTRAVENOUS

## 2015-12-25 ENCOUNTER — Telehealth: Payer: Self-pay | Admitting: *Deleted

## 2015-12-25 NOTE — Telephone Encounter (Signed)
Per Dr Marjory LiesPenumalli, spoke with patient and  informed her that her CT angio of her head shows unremarkable imaging results. Her stent appears to be stable. Advised her there is no aneurysm regrowth.  Dr Marjory LiesPenumalli will continue with her current plan; reviewed that with her. She verbalized understanding. She then stated she "forget to tell him about another problem". She stated she was dx with DDD of lower back, has had previous MRIs, injections. She stated she continues to have a lot of pain, numbness in her legs. She inquired about seeing Dr Marjory LiesPenumalli for this issue. Advised her that she needs a referral as this is a new problem. Also advised her Dr Marjory LiesPenumalli would need past information, MRI results, history, etc for her appointment. Advised she discuss with her PCP. She verbalized understanding, appreciation for call.

## 2016-01-06 NOTE — Congregational Nurse Program (Signed)
Congregational Nurse Program Note  Date of Encounter: 12/24/2015  Past Medical History: Past Medical History:  Diagnosis Date  . ADD (attention deficit disorder)   . Anxiety   . Back pain   . Cerebral aneurysm   . Chiari malformation   . Depression   . Fibromyalgia   . GERD (gastroesophageal reflux disease)   . Hypertension   . Interstitial cystitis   . Kidney stones   . Lupus   . Migraine   . Osteoarthritis   . Raynaud disease   . Seasonal allergies   . Sjogren's disease [facility])     Encounter Details:     CNP Questionnaire - 01/06/16 1752      Patient Demographics   Is this a new or existing patient? Existing   Patient is considered a/an Not Applicable   Race Caucasian/White     Patient Assistance   Location of Patient Assistance Not Applicable   Patient's financial/insurance status Medicaid   Uninsured Patient No   Patient referred to apply for the following financial assistance Not Applicable   Food insecurities addressed Not Applicable   Transportation assistance No   Assistance securing medications No   Educational health offerings Behavioral health;Hypertension;Medications     Encounter Details   Primary purpose of visit Education/Health Concerns;Spiritual Care/Support Visit;Chronic Illness/Condition Visit   Was an Emergency Department visit averted? Yes   Does patient have a medical provider? Yes   Patient referred to Follow up with established PCP   Was a mental health screening completed? (GAINS tool) No   Does patient have dental issues? No   Does patient have vision issues? No   Was a vision referral made? No   Does your patient have an abnormal blood pressure today? No   Since previous encounter, have you referred patient for abnormal blood pressure that resulted in a new diagnosis or medication change? Yes   Does your patient have an abnormal blood glucose today? No   Since previous encounter, have you referred patient for abnormal blood glucose  that resulted in a new diagnosis or medication change? No   Was there a life-saving intervention made? No    Nurse  Saw  Client twice  Today  As she  First  Came in today stating she had a meeting with  Case management  At  4;30 pm was somewhat  Anxious  But  Okay , blood  Pressure  Okay , counseled. Client will soon be moving out  And is  excited about that . Nurse ,tried to  Get  client to be positive  About her goal accomplishments . Client is  Always  Anxious  About something. Client in to  Nurse's office  Again around  7 pm as nurse was packing up , very emotional crying and  Upset states she misses her husband ,is lonely  And they  Cancelled her  Meeting with  Case management . Nurse allowed client to  Verbalize her feeling , offered support and attempted to  Allow her  To calm herself  Down ,stoop trying  And take the  Next steps to  Get her life in order ,. Cloient had  Called  Her husband   And he  Told her he  Had to  Work  . Client kept  repeating how  Lonely  She  Was . Nurse remained  Supportive and  Client was finally  Feeling  Somewhat better.Offered to  Call  crisis  Unit and or 911  as clients  Anxiety level escalated very  High before finally  Calming. Down. Has a therapy appointment  December 29, 2015  To  Hopefully  Get her back on her  Anxiety medications.  Nurse retook vitals  Blood pressure  132/88  Pulse 88 . Client will [facility] living  [facility]  [address]  [facility] ,excited about her move. Instructed to  go to her room take a relaxing shower and try to  Relax ,client seemed to be okay now.

## 2016-01-21 ENCOUNTER — Ambulatory Visit (INDEPENDENT_AMBULATORY_CARE_PROVIDER_SITE_OTHER): Payer: Medicaid Other | Admitting: Diagnostic Neuroimaging

## 2016-01-21 ENCOUNTER — Encounter: Payer: Self-pay | Admitting: Diagnostic Neuroimaging

## 2016-01-21 VITALS — BP 162/98 | HR 67 | Wt 160.4 lb

## 2016-01-21 DIAGNOSIS — G2401 Drug induced subacute dyskinesia: Secondary | ICD-10-CM

## 2016-01-21 DIAGNOSIS — G43109 Migraine with aura, not intractable, without status migrainosus: Secondary | ICD-10-CM

## 2016-01-21 DIAGNOSIS — I671 Cerebral aneurysm, nonruptured: Secondary | ICD-10-CM

## 2016-01-21 MED ORDER — PROPRANOLOL HCL ER 80 MG PO CP24: 80.0000 mg | ORAL_CAPSULE | Freq: Every day | ORAL | 4 refills | Status: DC

## 2016-01-21 NOTE — Patient Instructions (Signed)
-   increase propranolol to 80mg  daily  - start valbenazine (Ingrezza) 40mg  daily for tardive dyskinesia
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
## 2016-02-11 ENCOUNTER — Telehealth: Payer: Self-pay | Admitting: Diagnostic Neuroimaging

## 2016-02-11 NOTE — Telephone Encounter (Signed)
Patient called to check on status of paperwork that Dr. Marjory LiesPenumalli was filling out for medication that treats tardive dyskinesia, patient states she hasn't received anything or heard anything related to this. Please call [phone].

## 2016-02-12 NOTE — Telephone Encounter (Signed)
Called patient and apologized for the delay in getting her medication. Explained this RN needed to clarify her preferred phone number and correct address. She verified those with this RN. Advised her the paper prescription form needs her signature and date, and she may come in to office any time during regular hours to sign.  Advised her the office closes at noon on Fridays. She stated she would come by today or tomorrow. Advised she inform front desk staff she needs to sign a form for Dr Marjory LiesPenumalli. She verbalized understanding, appreciation.

## 2016-02-13 NOTE — Telephone Encounter (Signed)
rx signed. -VRP 

## 2016-02-16 NOTE — Telephone Encounter (Signed)
I spoke to the patient and she states that in the last few weeks it has a new problem of pain about the right eye with some swelling. This is intermittent. I advised her to have this seen in the urgent care if the pain is bothersome and she cannot wait otherwise call the office and request an appointment to be seen for a new problem. She voiced understanding.

## 2016-02-16 NOTE — Telephone Encounter (Signed)
Called patient to inform her the form for new medication for her tardive dyskinesia is ready for her to sign. She stated she would come in later today.   She then stated that her right eye "constantly hurts above it in one little spot". She stated "It is hard to describe. It feels weird."  She stated this began 2-3 weeks ago. She voiced concern whether this is related to her tardive dyskinesia or cerebral aneurysm, stated "I don't want to have a stroke." She stated she has taken Ibuprofen without relief; she was unclear as to whether she has taken Tylenol. She is intolerant of statins, cannot take fioricet. She stated she did increase Inderal LA to 80 mg as Dr Marjory LiesPenumalli instructed on office visit 01/21/16. She questioned if she needs to increase again. This RN informed her Dr Danae OrleansPenumali is out of the office this week and due to this being a new issue, she cannot be seen by NP. This RN inquired about her calling her PCP at which patient laughed and stated she cannot get them to call her back. She "wants to change to a [facility] PCP." Inquired about her pain clinic referral, and patient stated she has not heard about that referral which was made by her PCP "several months ago".  This RN advised that if she cannot get relief from this pain over her right eye, to call her PCP, go to Urgent Care or ED. Patient sounded tearful, requested that a physician in this office be asked what she should do. She stated "It's hard for me to get around. I have to take the bus."  This RN stated she would send note to work in doctor and call her back later today. She verbalized understanding, appreciation.

## 2016-02-17 NOTE — Telephone Encounter (Signed)
Noted  

## 2016-02-24 ENCOUNTER — Telehealth: Payer: Self-pay | Admitting: *Deleted

## 2016-02-24 NOTE — Telephone Encounter (Signed)
LVM reminding patient the papers for new medication are ready for her to sign. Left number for any questions.

## 2016-03-24 ENCOUNTER — Ambulatory Visit: Payer: Medicaid Other | Admitting: Diagnostic Neuroimaging

## 2016-04-01 ENCOUNTER — Telehealth: Payer: Self-pay | Admitting: *Deleted

## 2016-04-01 NOTE — Telephone Encounter (Signed)
Spoke with patient and rescheduled her follow up due to provider being OOO. Also reminded her of papers to sign in office. She stated she had not forgotten but had been very busy. She verbalized understanding of call.

## 2016-04-18 ENCOUNTER — Inpatient Hospital Stay (HOSPITAL_COMMUNITY)
Admission: EM | Admit: 2016-04-18 | Discharge: 2016-04-24 | DRG: 872 | Disposition: A | Discharge status: Home Health | Payer: Medicaid Other | Attending: Internal Medicine | Admitting: Internal Medicine

## 2016-04-18 ENCOUNTER — Encounter (HOSPITAL_COMMUNITY): Payer: Self-pay | Admitting: Emergency Medicine

## 2016-04-18 DIAGNOSIS — N179 Acute kidney failure, unspecified: Secondary | ICD-10-CM | POA: Diagnosis present

## 2016-04-18 DIAGNOSIS — N3010 Interstitial cystitis (chronic) without hematuria: Secondary | ICD-10-CM | POA: Diagnosis present

## 2016-04-18 DIAGNOSIS — M3500 Sicca syndrome, unspecified: Secondary | ICD-10-CM | POA: Diagnosis present

## 2016-04-18 DIAGNOSIS — L03114 Cellulitis of left upper limb: Secondary | ICD-10-CM | POA: Diagnosis present

## 2016-04-18 DIAGNOSIS — I671 Cerebral aneurysm, nonruptured: Secondary | ICD-10-CM | POA: Diagnosis present

## 2016-04-18 DIAGNOSIS — L0291 Cutaneous abscess, unspecified: Secondary | ICD-10-CM | POA: Diagnosis not present

## 2016-04-18 DIAGNOSIS — A4102 Sepsis due to Methicillin resistant Staphylococcus aureus: Secondary | ICD-10-CM | POA: Diagnosis not present

## 2016-04-18 DIAGNOSIS — F319 Bipolar disorder, unspecified: Secondary | ICD-10-CM | POA: Diagnosis present

## 2016-04-18 DIAGNOSIS — M549 Dorsalgia, unspecified: Secondary | ICD-10-CM | POA: Diagnosis present

## 2016-04-18 DIAGNOSIS — Z8249 Family history of ischemic heart disease and other diseases of the circulatory system: Secondary | ICD-10-CM

## 2016-04-18 DIAGNOSIS — F17210 Nicotine dependence, cigarettes, uncomplicated: Secondary | ICD-10-CM | POA: Diagnosis present

## 2016-04-18 DIAGNOSIS — Z880 Allergy status to penicillin: Secondary | ICD-10-CM

## 2016-04-18 DIAGNOSIS — Z881 Allergy status to other antibiotic agents status: Secondary | ICD-10-CM | POA: Diagnosis not present

## 2016-04-18 DIAGNOSIS — Z91048 Other nonmedicinal substance allergy status: Secondary | ICD-10-CM | POA: Diagnosis not present

## 2016-04-18 DIAGNOSIS — L02412 Cutaneous abscess of left axilla: Secondary | ICD-10-CM

## 2016-04-18 DIAGNOSIS — M329 Systemic lupus erythematosus, unspecified: Secondary | ICD-10-CM | POA: Diagnosis present

## 2016-04-18 DIAGNOSIS — A419 Sepsis, unspecified organism: Principal | ICD-10-CM | POA: Diagnosis present

## 2016-04-18 DIAGNOSIS — Z23 Encounter for immunization: Secondary | ICD-10-CM

## 2016-04-18 DIAGNOSIS — Z888 Allergy status to other drugs, medicaments and biological substances status: Secondary | ICD-10-CM | POA: Diagnosis not present

## 2016-04-18 DIAGNOSIS — Z79899 Other long term (current) drug therapy: Secondary | ICD-10-CM

## 2016-04-18 DIAGNOSIS — M797 Fibromyalgia: Secondary | ICD-10-CM | POA: Diagnosis present

## 2016-04-18 DIAGNOSIS — I959 Hypotension, unspecified: Secondary | ICD-10-CM | POA: Diagnosis not present

## 2016-04-18 DIAGNOSIS — F419 Anxiety disorder, unspecified: Secondary | ICD-10-CM | POA: Diagnosis present

## 2016-04-18 DIAGNOSIS — G8929 Other chronic pain: Secondary | ICD-10-CM | POA: Diagnosis present

## 2016-04-18 DIAGNOSIS — K219 Gastro-esophageal reflux disease without esophagitis: Secondary | ICD-10-CM | POA: Diagnosis present

## 2016-04-18 DIAGNOSIS — G43109 Migraine with aura, not intractable, without status migrainosus: Secondary | ICD-10-CM | POA: Diagnosis present

## 2016-04-18 DIAGNOSIS — E872 Acidosis: Secondary | ICD-10-CM | POA: Diagnosis present

## 2016-04-18 DIAGNOSIS — I10 Essential (primary) hypertension: Secondary | ICD-10-CM | POA: Diagnosis present

## 2016-04-18 DIAGNOSIS — Z885 Allergy status to narcotic agent status: Secondary | ICD-10-CM

## 2016-04-18 DIAGNOSIS — M539 Dorsopathy, unspecified: Secondary | ICD-10-CM

## 2016-04-18 LAB — CBC WITH DIFFERENTIAL/PLATELET
Basophils Absolute: 0 10*3/uL (ref 0.0–0.1)
Basophils Relative: 0 %
Eosinophils Absolute: 0.4 10*3/uL (ref 0.0–0.7)
Eosinophils Relative: 3 %
HCT: 34.2 % — ABNORMAL LOW (ref 36.0–46.0)
Hemoglobin: 11.2 g/dL — ABNORMAL LOW (ref 12.0–15.0)
Lymphocytes Relative: 9 %
Lymphs Abs: 1.5 10*3/uL (ref 0.7–4.0)
MCH: 32.3 pg (ref 26.0–34.0)
MCHC: 32.7 g/dL (ref 30.0–36.0)
MCV: 98.6 fL (ref 78.0–100.0)
Monocytes Absolute: 1.6 10*3/uL — ABNORMAL HIGH (ref 0.1–1.0)
Monocytes Relative: 10 %
Neutro Abs: 13.4 10*3/uL — ABNORMAL HIGH (ref 1.7–7.7)
Neutrophils Relative %: 78 %
Platelets: 269 10*3/uL (ref 150–400)
RBC: 3.47 MIL/uL — ABNORMAL LOW (ref 3.87–5.11)
RDW: 13.2 % (ref 11.5–15.5)
WBC: 17 10*3/uL — ABNORMAL HIGH (ref 4.0–10.5)

## 2016-04-18 LAB — BASIC METABOLIC PANEL
Anion gap: 11 (ref 5–15)
BUN: 22 mg/dL — ABNORMAL HIGH (ref 6–20)
CO2: 20 mmol/L — ABNORMAL LOW (ref 22–32)
Calcium: 9.1 mg/dL (ref 8.9–10.3)
Chloride: 105 mmol/L (ref 101–111)
Creatinine, Ser: 1.53 mg/dL — ABNORMAL HIGH (ref 0.44–1.00)
GFR calc Af Amer: 48 mL/min — ABNORMAL LOW (ref >60)
GFR calc non Af Amer: 42 mL/min — ABNORMAL LOW (ref >60)
Glucose, Bld: 93 mg/dL (ref 65–99)
Potassium: 3.7 mmol/L (ref 3.5–5.1)
Sodium: 136 mmol/L (ref 135–145)

## 2016-04-18 LAB — I-STAT CG4 LACTIC ACID, ED: LACTIC ACID, VENOUS: 0.56 mmol/L (ref 0.5–1.9)

## 2016-04-18 LAB — MRSA PCR SCREENING: MRSA BY PCR: NEGATIVE

## 2016-04-18 MED ORDER — HYDROMORPHONE HCL 1 MG/ML IJ SOLN
1.0000 mg | INTRAMUSCULAR | Status: DC | PRN
Start: 2016-04-18 — End: 2016-04-19
  Administered 2016-04-18 – 2016-04-19 (×5): 1 mg via INTRAVENOUS
  Filled 2016-04-18 (×5): qty 1

## 2016-04-18 MED ORDER — ACETAMINOPHEN 650 MG RE SUPP: 650.0000 mg | Freq: Four times a day (QID) | RECTAL | Status: DC | PRN

## 2016-04-18 MED ORDER — INFLUENZA VAC SPLIT QUAD 0.5 ML IM SUSY
0.5000 mL | PREFILLED_SYRINGE | INTRAMUSCULAR | Status: DC
  Filled 2016-04-18: qty 0.5

## 2016-04-18 MED ORDER — VANCOMYCIN HCL IN DEXTROSE 1-5 GM/200ML-% IV SOLN
1000.0000 mg | Freq: Once | INTRAVENOUS | Status: AC
  Administered 2016-04-18: 1000 mg via INTRAVENOUS
  Filled 2016-04-18: qty 200

## 2016-04-18 MED ORDER — PROPRANOLOL HCL ER 80 MG PO CP24
80.0000 mg | ORAL_CAPSULE | Freq: Every day | ORAL | Status: DC
  Administered 2016-04-18 – 2016-04-24 (×7): 80 mg via ORAL
  Filled 2016-04-18 (×7): qty 1

## 2016-04-18 MED ORDER — LIDOCAINE HCL 2 % IJ SOLN
20.0000 mL | Freq: Once | INTRAMUSCULAR | Status: AC
  Administered 2016-04-18: 400 mg
  Filled 2016-04-18: qty 20

## 2016-04-18 MED ORDER — MONTELUKAST SODIUM 10 MG PO TABS
10.0000 mg | ORAL_TABLET | Freq: Every day | ORAL | Status: DC
  Administered 2016-04-18 – 2016-04-24 (×7): 10 mg via ORAL
  Filled 2016-04-18 (×7): qty 1

## 2016-04-18 MED ORDER — SODIUM CHLORIDE 0.9 % IV BOLUS (SEPSIS)
1000.0000 mL | Freq: Once | INTRAVENOUS | Status: AC
  Administered 2016-04-18: 1000 mL via INTRAVENOUS

## 2016-04-18 MED ORDER — PANTOPRAZOLE SODIUM 40 MG PO TBEC
40.0000 mg | DELAYED_RELEASE_TABLET | Freq: Every day | ORAL | Status: DC
  Administered 2016-04-18 – 2016-04-24 (×7): 40 mg via ORAL
  Filled 2016-04-18 (×7): qty 1

## 2016-04-18 MED ORDER — ONDANSETRON HCL 4 MG/2ML IJ SOLN
4.0000 mg | Freq: Once | INTRAMUSCULAR | Status: AC
  Administered 2016-04-18: 4 mg via INTRAVENOUS
  Filled 2016-04-18: qty 2

## 2016-04-18 MED ORDER — CLINDAMYCIN PHOSPHATE 900 MG/50ML IV SOLN
900.0000 mg | Freq: Once | INTRAVENOUS | Status: AC
  Administered 2016-04-18: 900 mg via INTRAVENOUS
  Filled 2016-04-18: qty 50

## 2016-04-18 MED ORDER — VANCOMYCIN HCL 500 MG IV SOLR
500.0000 mg | Freq: Two times a day (BID) | INTRAVENOUS | Status: DC
  Administered 2016-04-18 – 2016-04-19 (×2): 500 mg via INTRAVENOUS
  Filled 2016-04-18 (×2): qty 500

## 2016-04-18 MED ORDER — HYDROMORPHONE HCL 1 MG/ML IJ SOLN
1.0000 mg | Freq: Once | INTRAMUSCULAR | Status: AC
  Administered 2016-04-18: 1 mg via INTRAVENOUS
  Filled 2016-04-18: qty 1

## 2016-04-18 MED ORDER — ONDANSETRON HCL 4 MG/2ML IJ SOLN: 4.0000 mg | Freq: Four times a day (QID) | INTRAMUSCULAR | Status: DC | PRN

## 2016-04-18 MED ORDER — ENOXAPARIN SODIUM 40 MG/0.4ML ~~LOC~~ SOLN
40.0000 mg | Freq: Every day | SUBCUTANEOUS | Status: DC
  Administered 2016-04-18 – 2016-04-21 (×4): 40 mg via SUBCUTANEOUS
  Filled 2016-04-18 (×4): qty 0.4

## 2016-04-18 MED ORDER — ACETAMINOPHEN 325 MG PO TABS
650.0000 mg | ORAL_TABLET | Freq: Four times a day (QID) | ORAL | Status: DC | PRN
  Administered 2016-04-18 – 2016-04-23 (×7): 650 mg via ORAL
  Filled 2016-04-18 (×7): qty 2

## 2016-04-18 MED ORDER — GABAPENTIN 100 MG PO CAPS
100.0000 mg | ORAL_CAPSULE | Freq: Three times a day (TID) | ORAL | Status: DC
  Administered 2016-04-18 – 2016-04-24 (×18): 100 mg via ORAL
  Filled 2016-04-18 (×18): qty 1

## 2016-04-18 MED ORDER — VANCOMYCIN HCL IN DEXTROSE 1-5 GM/200ML-% IV SOLN: 1000.0000 mg | Freq: Once | INTRAVENOUS | Status: DC

## 2016-04-18 MED ORDER — DIPHENHYDRAMINE HCL 25 MG PO CAPS
25.0000 mg | ORAL_CAPSULE | Freq: Every evening | ORAL | Status: DC | PRN
  Administered 2016-04-18 – 2016-04-23 (×5): 25 mg via ORAL
  Filled 2016-04-18 (×5): qty 1

## 2016-04-18 MED ORDER — HYDROMORPHONE HCL 1 MG/ML IJ SOLN
1.0000 mg | Freq: Once | INTRAMUSCULAR | Status: AC
  Administered 2016-04-18: 1 mg via INTRAVENOUS

## 2016-04-18 MED ORDER — SODIUM CHLORIDE 0.9 % IV SOLN
INTRAVENOUS | Status: DC
  Administered 2016-04-18: 100 mL via INTRAVENOUS
  Administered 2016-04-19 – 2016-04-22 (×7): via INTRAVENOUS

## 2016-04-18 MED ORDER — SODIUM CHLORIDE 0.9% FLUSH
3.0000 mL | Freq: Two times a day (BID) | INTRAVENOUS | Status: DC
  Administered 2016-04-18 – 2016-04-24 (×8): 3 mL via INTRAVENOUS

## 2016-04-18 MED ORDER — TRAMADOL HCL 50 MG PO TABS
50.0000 mg | ORAL_TABLET | Freq: Four times a day (QID) | ORAL | Status: DC | PRN
  Administered 2016-04-18 – 2016-04-19 (×3): 50 mg via ORAL
  Filled 2016-04-18 (×3): qty 1

## 2016-04-18 MED ORDER — ONDANSETRON HCL 4 MG PO TABS: 4.0000 mg | ORAL_TABLET | Freq: Four times a day (QID) | ORAL | Status: DC | PRN

## 2016-04-18 MED ORDER — IPRATROPIUM BROMIDE 0.06 % NA SOLN
2.0000 | Freq: Two times a day (BID) | NASAL | Status: DC
  Administered 2016-04-19 – 2016-04-24 (×11): 2 via NASAL
  Filled 2016-04-18: qty 15

## 2016-04-18 NOTE — ED Triage Notes (Signed)
Pt has redness from the axilla down to right above the left elbow

## 2016-04-18 NOTE — ED Notes (Signed)
hospitalist at bedside

## 2016-04-18 NOTE — H&P (Signed)
History and Physical    Joy Nelson ZOX:[id number] DOB: 07-Aug-1975 DOA: 04/18/2016  PCP: Daune Perch, NP   Patient coming from: Home   Chief Complaint: Left axillar pain.   HPI: Joy Nelson is a 41 y.o. female with medical history significant of hypertension and sjogren's disease who presents with left axillary pain. The pain has been ongoing for last 3 days, it has been severe intensity, 10 out of 10, sharp in nature, worse with movement, associated with fevers, chills, decreased appetite and generalized malaise. There is no improving or worsening factors. At the site of the pain pain she does have a erythematous rash that has been worsening initially in the axillary region and then expanded to the proximal arm mainly in the inner part. In the center of the skin lesion she developed a fluctuant painful mass which has been drained in the emergency department.  About 3 weeks ago she had a similar left axillary lesion which spontaneously ruptured with improvement of her symptoms. She shaves her axillary regions on routine basis, denies erythema or irritation, besides occasional self resolving  pustular lesions.   ED Course: Patient was diagnosed with left axillary abscess, lesion has been drained, antibiotics were started.  Review of Systems:  1. Gen. no weight gain or weight loss, positive for fevers and chills 2. ENT, no runny nose or sore throat 3. Gastrointestinal no nausea, vomiting or diarrhea 4. Pulmonary no shortness of breath, cough or hemoptysis 5. Cardiovascular, no angina, claudication or syncope 6. Musculoskeletal no joint pain 7. Dermatologic positive for rash at the left axillary region as mentioned in history of present illness 8. Endocrine. No tremors, heat or cold intolerance 9. Hematology no easy bruisability or frequent infections 10. Neurology no seizures or paresthesias  Past Medical History:  Diagnosis Date  . ADD (attention deficit disorder)   .  Anxiety   . Back pain   . Cerebral aneurysm   . Chiari malformation   . Depression   . Fibromyalgia   . GERD (gastroesophageal reflux disease)   . Hypertension   . Interstitial cystitis   . Kidney stones   . Lupus   . Migraine   . Osteoarthritis   . Raynaud disease   . Seasonal allergies   . Sjogren's disease Joy Nelson)     Past Surgical History:  Procedure Laterality Date  . APPENDECTOMY    . BRAIN SURGERY  02/2014   pipeline stent  . CHOLECYSTECTOMY    . TONSILLECTOMY    . TUBAL LIGATION       reports that she has been smoking Cigarettes.  She has been smoking about 0.25 packs per day. She has never used smokeless tobacco. She reports that she drinks alcohol. She reports that she does not use drugs.  Allergies  Allergen Reactions  . Methotrexate Derivatives Other (See Comments)    Flushing   . Celecoxib Swelling  . Morphine And Related Itching  . Other     Imitrex  . Penicillins Other (See Comments)    Childhood reaction.  Turns blue and has convulsions.  . Sulfa Antibiotics Swelling  . Tape Rash    Can have paper tape.     Family History  Problem Relation Age of Onset  . Hypertension Mother   . Stroke Mother   . Migraines Mother   . Hypertension Father   . Migraines Father    Unacceptable: Noncontributory, unremarkable, or negative. Acceptable: Family history reviewed and not pertinent (If you reviewed it)  Prior  to Admission medications   Medication Sig Start Date End Date Taking? Authorizing Provider  diphenhydrAMINE (BENADRYL) 25 mg capsule Take 25 mg by mouth at bedtime as needed for allergies.   Yes Historical Provider, MD  ipratropium (ATROVENT) 0.06 % nasal spray Place 2 sprays into both nostrils 4 (four) times daily. Patient taking differently: Place 2 sprays into both nostrils 2 (two) times daily.  11/25/15  Yes Linna Hoff, MD  lisinopril (PRINIVIL,ZESTRIL) 10 MG tablet Take 1 tablet (10 mg total) by mouth daily. 11/28/15  Yes Dillard Cannon Young, PA-C   montelukast (SINGULAIR) 10 MG tablet Take 10 mg by mouth daily.    Yes Historical Provider, MD  omeprazole (PRILOSEC) 20 MG capsule Take 20 mg by mouth daily. 12/05/15  Yes Historical Provider, MD  propranolol ER (INDERAL LA) 80 MG 24 hr capsule Take 1 capsule (80 mg total) by mouth daily. 01/21/16  Yes Suanne Marker, MD  gabapentin (NEURONTIN) 100 MG capsule Take 100 mg by mouth 3 (three) times daily.    Historical Provider, MD    Physical Exam: Vitals:   04/18/16 1610 04/18/16 0540 04/18/16 0756 04/18/16 1200  BP: 109/68 115/57 (!) 94/52 100/58  Pulse: 83 80 76 72  Resp: 16 18 18 18   Temp: 98.3 F (36.8 C)   98.3 F (36.8 C)  TempSrc: Oral   Oral  SpO2: 100% 100% 98% 98%  Weight: 70.3 kg (155 lb)     Height: 5\' 2"  (1.575 m)         Constitutional: deconditioned and ill looking appearing, patient in pain.  Vitals:   04/18/16 0333 04/18/16 0540 04/18/16 0756 04/18/16 1200  BP: 109/68 115/57 (!) 94/52 100/58  Pulse: 83 80 76 72  Resp: 16 18 18 18   Temp: 98.3 F (36.8 C)   98.3 F (36.8 C)  TempSrc: Oral   Oral  SpO2: 100% 100% 98% 98%  Weight: 70.3 kg (155 lb)     Height: 5\' 2"  (1.575 m)      Eyes: PERRL, lids and conjunctivae normal, no icterus Head normocephalic, nose and ears no deformities.  ENMT: Mucous membranes are moist. Posterior pharynx clear of any exudate or lesions.Normal dentition.  Neck: normal, supple, no masses, no thyromegaly Respiratory: clear to auscultation bilaterally, no wheezing, no crackles. Normal respiratory effort. No accessory muscle use.  Cardiovascular: Regular rate and rhythm, no murmurs / rubs / gallops. No extremity edema. 2+ pedal pulses. No carotid bruits.  Abdomen: no tenderness, no masses palpated. No hepatosplenomegaly. Bowel sounds positive.  Musculoskeletal: no clubbing / cyanosis. No joint deformity upper and lower extremities. Good ROM, no contractures. Normal muscle tone.  Skin: abscess lesion has been drained on the left  axillary region, it has been packed, positive surrounding erythema, expanding into the proximal arm, inner part, not crossing the posterior axillary line.   Neurologic: CN 2-12 grossly intact. Sensation intact, DTR normal. Strength 5/5 in all 4.    Labs on Admission: I have personally reviewed following labs and imaging studies  CBC:  Recent Labs Lab 04/18/16 0756  WBC 17.0*  NEUTROABS 13.4*  HGB 11.2*  HCT 34.2*  MCV 98.6  PLT 269   Basic Metabolic Panel:  Recent Labs Lab 04/18/16 0756  NA 136  K 3.7  CL 105  CO2 20*  GLUCOSE 93  BUN 22*  CREATININE 1.53*  CALCIUM 9.1   GFR: Estimated Creatinine Clearance: 44.9 mL/min (by C-G formula based on SCr of 1.53 mg/dL (H)). Liver Function  Tests: No results for input(s): AST, ALT, ALKPHOS, BILITOT, PROT, ALBUMIN in the last 168 hours. No results for input(s): LIPASE, AMYLASE in the last 168 hours. No results for input(s): AMMONIA in the last 168 hours. Coagulation Profile: No results for input(s): INR, PROTIME in the last 168 hours. Cardiac Enzymes: No results for input(s): CKTOTAL, CKMB, CKMBINDEX, TROPONINI in the last 168 hours. BNP (last 3 results) No results for input(s): PROBNP in the last 8760 hours. HbA1C: No results for input(s): HGBA1C in the last 72 hours. CBG: No results for input(s): GLUCAP in the last 168 hours. Lipid Profile: No results for input(s): CHOL, HDL, LDLCALC, TRIG, CHOLHDL, LDLDIRECT in the last 72 hours. Thyroid Function Tests: No results for input(s): TSH, T4TOTAL, FREET4, T3FREE, THYROIDAB in the last 72 hours. Anemia Panel: No results for input(s): VITAMINB12, FOLATE, FERRITIN, TIBC, IRON, RETICCTPCT in the last 72 hours. Urine analysis: No results found for: COLORURINE, APPEARANCEUR, LABSPEC, PHURINE, GLUCOSEU, HGBUR, BILIRUBINUR, KETONESUR, PROTEINUR, UROBILINOGEN, NITRITE, LEUKOCYTESUR Sepsis Labs:  !!!!!!!!!!!!!!!!!!!!!!!!!!!!!!!!!!!!!!!!!!!! @LABRCNTIP (procalcitonin:4,lacticidven:4) )No results found for this or any previous visit (from the past 240 hour(s)).   Radiological Exams on Admission: No results found.  EKG: Independently reviewed. NA  Assessment/Plan Active Problems:   Cellulitis of arm, left   Abscess   This is a 41 year old female with a past medical history of hypertension and Sjogren's who precents with a left axillary abscess, associated with an expanding and worsening cellulitis. On initial physical examination she was febrile 100.6, blood pressure 94/52, after IV fluids up to 100/50, heart rate 76-83, respiratory rate 18 and oxygen saturation 98% on room air. She is awake and alert, she does have a drained abscess on the left axillary region with severe cellulitis in the surrounding tissues. Sodium 136, potassium 3.7, chloride 105, bicarbonate 20, glucose 93, BUN 22, creatinine 1.3, white count 17.0, hemoglobin 11.2, hematocrit 34.2, platelets 269, lactic acid 0.56.  Working diagnosis. Sepsis due to left axillary abscess complicated by cellulitis and acute kidney injury.  1. Sepsis, due to abscesses and cellulitis, present on admission. Even though patient is not tachypneic or tachycardic, she does have fever and she did develop hypotension. Likely Staphylococcus infection, will start IV vancomycin, follow-up on cultures, cell count and temperature curve. The abscess has been drained in the emergency department. Continue pain control with hydromorphone and Ultram, note patient allergic to Celebrex. Follow-up on MRSA screen. Will continue IV fluids with normal saline at 100 ml per hour, will target map of 65. Admit patient to telemetry monitoring.   2. Acute kidney injury. Probably prerenal, associated with cellulitis and sepsis syndrome. Will continue aggressive hydration with normal saline at 100 mL per hour, will follow-up kidney function morning. Strict in and output.  Patient with non-anion gap metabolic acidosis.  3. Hypertension. Will continue propanolol, with hold on lisinopril, to avoid hypotension. Will continue hydration with isotonic saline.   4. GERD. Continue omeprazole.    DVT prophylaxis: enoxaparin  Code Status: full   Family Communication: no family at the bedside  Disposition Plan: Home  Consults called: None Admission status: Inpatient   Joy Nelson Joy Gulaaniel Eloyce Bultman MD [facility] Pager [phone]- [phone]  If 7PM-7AM, please contact night-coverage [email or web address] Password [email or web address]  04/18/2016, 12:09 PM

## 2016-04-18 NOTE — Progress Notes (Signed)
Patient has arrived to the unit a&ox4, ambulatory. Patient had an I&D to left axillary area. Site has been marked to assess site daily for improvement.

## 2016-04-18 NOTE — ED Notes (Signed)
Patient was stuck twice for an IV and unsuccessful both times. Requested someone else to try.

## 2016-04-18 NOTE — Progress Notes (Signed)
Pharmacy Antibiotic Note  Joy Nelson is a 41 y.o. female with left under arm abscess that spontaneously popped a few weeks ago, presented to the ED on 04/18/16 with c/o of worsening pain from abscess area.  To start vancomycin for abscess and suspected sepsis.  - afeb, wbc 17, scr 1.53 (crcl~45)   Plan: - vancomycin 1000 mg IV x1, then 500 mg IV q12h (will aim for 15-20 level until bacteremia is r/o) - monitor renal function closely  ________________________________  Height: 5\' 2"  (157.5 cm) Weight: 155 lb (70.3 kg) IBW/kg (Calculated) : 50.1  Temp (24hrs), Avg:98.3 F (36.8 C), Min:98.3 F (36.8 C), Max:98.3 F (36.8 C)   Recent Labs Lab 04/18/16 0756  WBC 17.0*  CREATININE 1.53*    Estimated Creatinine Clearance: 44.9 mL/min (by C-G formula based on SCr of 1.53 mg/dL (H)).    Allergies  Allergen Reactions  . Methotrexate Derivatives Other (See Comments)    Flushing   . Celecoxib Swelling  . Morphine And Related Itching  . Other     Imitrex  . Penicillins Other (See Comments)    Childhood reaction.  Turns blue and has convulsions.  . Sulfa Antibiotics Swelling  . Tape Rash    Can have paper tape.      Thank you for allowing pharmacy to be a part of this patient's care.  Lucia Gaskinsham, Nyheim Seufert P 04/18/2016 9:35 AM

## 2016-04-18 NOTE — ED Triage Notes (Signed)
Pt came in by EMS with c/o abscess under her left arm  PT states she had one about 3 weeks ago but it popped on its own and then a new one came up  Pt states this one is worse than the last and it is burning and painful  PT states she had chills last night

## 2016-04-18 NOTE — ED Notes (Signed)
IV team to get blood with IV start

## 2016-04-18 NOTE — ED Notes (Signed)
PT ASKED FOR RN LAUREN TO OBTAIN LAB WORK.  RN MADE AWARE

## 2016-04-18 NOTE — ED Notes (Signed)
PA AWARE OF DELAY IN BLOOD DRAW

## 2016-04-19 ENCOUNTER — Inpatient Hospital Stay (HOSPITAL_COMMUNITY): Payer: Medicaid Other

## 2016-04-19 LAB — COMPREHENSIVE METABOLIC PANEL
ALBUMIN: 3 g/dL — AB (ref 3.5–5.0)
ALT: 10 U/L — ABNORMAL LOW (ref 14–54)
AST: 13 U/L — AB (ref 15–41)
Alkaline Phosphatase: 62 U/L (ref 38–126)
Anion gap: 6 (ref 5–15)
BILIRUBIN TOTAL: 0.3 mg/dL (ref 0.3–1.2)
BUN: 11 mg/dL (ref 6–20)
CHLORIDE: 110 mmol/L (ref 101–111)
CO2: 20 mmol/L — ABNORMAL LOW (ref 22–32)
Calcium: 8.2 mg/dL — ABNORMAL LOW (ref 8.9–10.3)
Creatinine, Ser: 0.68 mg/dL (ref 0.44–1.00)
GFR calc Af Amer: >60 mL/min (ref >60)
GFR calc non Af Amer: >60 mL/min (ref >60)
GLUCOSE: 94 mg/dL (ref 65–99)
POTASSIUM: 4.2 mmol/L (ref 3.5–5.1)
Sodium: 136 mmol/L (ref 135–145)
TOTAL PROTEIN: 6 g/dL — AB (ref 6.5–8.1)

## 2016-04-19 LAB — CBC
HEMATOCRIT: 31.5 % — AB (ref 36.0–46.0)
Hemoglobin: 10.2 g/dL — ABNORMAL LOW (ref 12.0–15.0)
MCH: 33 pg (ref 26.0–34.0)
MCHC: 32.4 g/dL (ref 30.0–36.0)
MCV: 101.9 fL — AB (ref 78.0–100.0)
Platelets: 252 10*3/uL (ref 150–400)
RBC: 3.09 MIL/uL — ABNORMAL LOW (ref 3.87–5.11)
RDW: 13.6 % (ref 11.5–15.5)
WBC: 10.8 10*3/uL — ABNORMAL HIGH (ref 4.0–10.5)

## 2016-04-19 MED ORDER — HYDROMORPHONE HCL 1 MG/ML IJ SOLN: 1.0000 mg | Freq: Once | INTRAMUSCULAR | Status: AC

## 2016-04-19 MED ORDER — AZTREONAM IN DEXTROSE 1 GM/50ML IV SOLN
1.0000 g | Freq: Three times a day (TID) | INTRAVENOUS | Status: DC
  Administered 2016-04-19 – 2016-04-24 (×16): 1 g via INTRAVENOUS
  Filled 2016-04-19 (×18): qty 50

## 2016-04-19 MED ORDER — IOPAMIDOL (ISOVUE-300) INJECTION 61%
INTRAVENOUS | Status: AC
  Filled 2016-04-19: qty 100

## 2016-04-19 MED ORDER — VANCOMYCIN HCL IN DEXTROSE 1-5 GM/200ML-% IV SOLN
1000.0000 mg | Freq: Two times a day (BID) | INTRAVENOUS | Status: DC
  Administered 2016-04-19 – 2016-04-24 (×11): 1000 mg via INTRAVENOUS
  Filled 2016-04-19 (×11): qty 200

## 2016-04-19 MED ORDER — HYDROMORPHONE HCL 1 MG/ML IJ SOLN
1.0000 mg | INTRAMUSCULAR | Status: DC | PRN
  Administered 2016-04-19 – 2016-04-24 (×38): 1 mg via INTRAVENOUS
  Filled 2016-04-19 (×39): qty 1

## 2016-04-19 MED ORDER — DEXTROSE 5 % IV SOLN
1.0000 g | Freq: Three times a day (TID) | INTRAVENOUS | Status: DC
  Filled 2016-04-19 (×2): qty 1

## 2016-04-19 MED ORDER — IOPAMIDOL (ISOVUE-300) INJECTION 61%
100.0000 mL | Freq: Once | INTRAVENOUS | Status: AC | PRN
  Administered 2016-04-19: 100 mL via INTRAVENOUS

## 2016-04-19 NOTE — Progress Notes (Signed)
PROGRESS NOTE                                                                                                                                                                                                             Patient Demographics:    Joy Nelson, is a 41 y.o. female, DOB - 1976/03/20, ZOX:[id number]  Admit date - 04/18/2016   Admitting Physician Mauricio Annett Gula, MD  Outpatient Primary MD for the patient is BLUNT-EVANS, LACHELLE A, NP  LOS - 1    Chief Complaint  Patient presents with  . Abscess       Brief Narrative  41 y.o. female with medical history significant of hypertension and sjogren's disease who presents with left axillary pain, workup significant for left axillary abscess, status post I&D in ED.   Subjective:    Joy Nelson today has, No headache, No chest pain, No abdominal pain - No Nausea, Planes of significant pain at left axillary area.   Assessment  & Plan :    Active Problems:   Cellulitis of arm, left   Abscess   Sepsis, due to abscesses and cellulitis. - she does have fever and she did develop hypotension.With leukocytosis. - S/P I&D in ED, unfortunately no cultures sent, continue with IV vancomycin, will add IV aztreonam given her allergy. - Follow blood cultures - Surgery consult appreciated, will follow on CT humers, for evidence of abscess, will need orthopedic consult.  Acute kidney injury.  -Probably prerenal, associated with cellulitis and sepsis syndrome, resolved with IV fluid  Hypertension.  - Will continue propanolol, with hold on lisinopril, to avoid hypotension. Will continue hydration with isotonic saline.   GERD.  - Continue omeprazole.    Code Status : Full  Family Communication  : None at edside  Disposition Plan  : Home when stable  Consults  :  Gen surgery  Procedures  : I&D in ED  DVT Prophylaxis  :  Lovenox - SCDs   Lab Results  Component Value Date   PLT  252 04/19/2016    Antibiotics  :    Anti-infectives    Start     Dose/Rate Route Frequency Ordered Stop   04/19/16 1800  vancomycin (VANCOCIN) IVPB 1000 mg/200 mL premix     1,000 mg 200 mL/hr over 60 Minutes Intravenous Every 12 hours 04/19/16  1048     04/18/16 2200  vancomycin (VANCOCIN) 500 mg in sodium chloride 0.9 % 100 mL IVPB  Status:  Discontinued     500 mg 100 mL/hr over 60 Minutes Intravenous Every 12 hours 04/18/16 0946 04/19/16 1048   04/18/16 1300  vancomycin (VANCOCIN) IVPB 1000 mg/200 mL premix  Status:  Discontinued     1,000 mg 200 mL/hr over 60 Minutes Intravenous  Once 04/18/16 1253 04/18/16 1302   04/18/16 0930  vancomycin (VANCOCIN) IVPB 1000 mg/200 mL premix     1,000 mg 200 mL/hr over 60 Minutes Intravenous  Once 04/18/16 0920 04/18/16 1333   04/18/16 0645  clindamycin (CLEOCIN) IVPB 900 mg     900 mg 100 mL/hr over 30 Minutes Intravenous  Once 04/18/16 0637 04/18/16 0821        Objective:   Vitals:   04/18/16 1251 04/18/16 2015 04/19/16 0258 04/19/16 0510  BP: 126/65 (!) 118/52  127/61  Pulse: 89 82  66  Resp: 18 18  18   Temp: (!) 100.4 F (38 C) 98.7 F (37.1 C) 99 F (37.2 C) 98.7 F (37.1 C)  TempSrc: Oral Oral  Oral  SpO2: 98% 99%  99%  Weight:      Height:        Wt Readings from Last 3 Encounters:  04/18/16 70.3 kg (155 lb)  01/21/16 72.8 kg (160 lb 6.4 oz)  12/19/15 74.8 kg (165 lb)     Intake/Output Summary (Last 24 hours) at 04/19/16 1206 Last data filed at 04/19/16 0913  Gross per 24 hour  Intake             3125 ml  Output              300 ml  Net             2825 ml     Physical Exam  Awake Alert, Oriented X 3, Supple Neck,No JVD,  Symmetrical Chest wall movement, Good air movement bilaterally, CTAB RRR,No Gallops,Rubs or new Murmurs, No Parasternal Heave +ve B.Sounds, Abd Soft, No tenderness , No rebound - guarding or rigidity. No Cyanosis, Clubbing or edema, in  bilateral lower extremity, left upper extremity  including the insular area of significant erythema and swelling.    Data Review:    CBC  Recent Labs Lab 04/18/16 0756 04/19/16 0541  WBC 17.0* 10.8*  HGB 11.2* 10.2*  HCT 34.2* 31.5*  PLT 269 252  MCV 98.6 101.9*  MCH 32.3 33.0  MCHC 32.7 32.4  RDW 13.2 13.6  LYMPHSABS 1.5  --   MONOABS 1.6*  --   EOSABS 0.4  --   BASOSABS 0.0  --     Chemistries   Recent Labs Lab 04/18/16 0756 04/19/16 0541  NA 136 136  K 3.7 4.2  CL 105 110  CO2 20* 20*  GLUCOSE 93 94  BUN 22* 11  CREATININE 1.53* 0.68  CALCIUM 9.1 8.2*  AST  --  13*  ALT  --  10*  ALKPHOS  --  62  BILITOT  --  0.3   ------------------------------------------------------------------------------------------------------------------ No results for input(s): CHOL, HDL, LDLCALC, TRIG, CHOLHDL, LDLDIRECT in the last 72 hours.  No results found for: HGBA1C ------------------------------------------------------------------------------------------------------------------ No results for input(s): TSH, T4TOTAL, T3FREE, THYROIDAB in the last 72 hours.  Invalid input(s): FREET3 ------------------------------------------------------------------------------------------------------------------ No results for input(s): VITAMINB12, FOLATE, FERRITIN, TIBC, IRON, RETICCTPCT in the last 72 hours.  Coagulation profile No results for input(s): INR, PROTIME in the last  168 hours.  No results for input(s): DDIMER in the last 72 hours.  Cardiac Enzymes No results for input(s): CKMB, TROPONINI, MYOGLOBIN in the last 168 hours.  Invalid input(s): CK ------------------------------------------------------------------------------------------------------------------ No results found for: BNP  Inpatient Medications  Scheduled Meds: . enoxaparin (LOVENOX) injection  40 mg Subcutaneous Daily  . gabapentin  100 mg Oral TID  . Influenza vac split quadrivalent PF  0.5 mL Intramuscular Tomorrow-1000  . ipratropium  2 spray Each  Nare BID  . montelukast  10 mg Oral Daily  . pantoprazole  40 mg Oral Daily  . propranolol ER  80 mg Oral Daily  . sodium chloride flush  3 mL Intravenous Q12H  . vancomycin  1,000 mg Intravenous Q12H   Continuous Infusions: . sodium chloride 100 mL/hr at 04/19/16 0910   PRN Meds:.acetaminophen **OR** acetaminophen, diphenhydrAMINE, HYDROmorphone (DILAUDID) injection, ondansetron **OR** ondansetron (ZOFRAN) IV, traMADol  Micro Results Recent Results (from the past 240 hour(s))  Blood Culture (routine x 2)     Status: None (Preliminary result)   Collection Time: 04/18/16 11:30 AM  Result Value Ref Range Status   Specimen Description BLOOD RIGHT ARM  Final   Special Requests BOTTLES DRAWN AEROBIC AND ANAEROBIC 5CC  Final   Culture PENDING  Incomplete   Report Status PENDING  Incomplete  MRSA PCR Screening     Status: None   Collection Time: 04/18/16  1:44 PM  Result Value Ref Range Status   MRSA by PCR NEGATIVE NEGATIVE Final    Comment:        The GeneXpert MRSA Assay (FDA approved for NASAL specimens only), is one component of a comprehensive MRSA colonization surveillance program. It is not intended to diagnose MRSA infection nor to guide or monitor treatment for MRSA infections.     Radiology Reports No results found.    Randol KernELGERGAWY, Joy Nelson M.D on 04/19/2016 at 12:06 PM  Between 7am to 7pm - Pager - [phone]  After 7pm go to [email or web address] - password [email or web address]  [facility] -  Office  [phone]

## 2016-04-19 NOTE — Progress Notes (Addendum)
Pharmacy Antibiotic Note  Joy Nelson is a 41 y.o. female with left under arm abscess that spontaneously popped a few weeks ago, presented to the ED on 04/18/16 with c/o of worsening pain from abscess area.  To start vancomycin for abscess and suspected sepsis. To also add aztreonam per pharmacy per Md orders. Note patient with hazy PCN allergy and cannot find in records admin of any cephalosporins either   Plan: 1) Renal function has improved a lot - will change vanc from 500mg  q12 to 1g q12 2) Aztreonam 1g IV q8  ________________________________  Height: 5\' 2"  (157.5 cm) Weight: 155 lb (70.3 kg) IBW/kg (Calculated) : 50.1  Temp (24hrs), Avg:99.3 F (37.4 C), Min:98.3 F (36.8 C), Max:100.6 F (38.1 C)   Recent Labs Lab 04/18/16 0756 04/18/16 1050 04/19/16 0541  WBC 17.0*  --  10.8*  CREATININE 1.53*  --  0.68  LATICACIDVEN  --  0.56  --     Estimated Creatinine Clearance: 85.9 mL/min (by C-G formula based on SCr of 0.68 mg/dL).    Allergies  Allergen Reactions  . Methotrexate Derivatives Other (See Comments)    Flushing   . Celecoxib Swelling  . Morphine And Related Itching  . Other     Imitrex  . Penicillins Other (See Comments)    Childhood reaction.  Turns blue and has convulsions.  . Sulfa Antibiotics Swelling  . Tape Rash    Can have paper tape.      Thank you for allowing pharmacy to be a part of this patient's care.   Hessie KnowsJustin M Lindzey Nelson, PharmD, BCPS Pager [phone] 04/19/2016 10:47 AM

## 2016-04-19 NOTE — Consult Note (Signed)
Reason for Consult:  Left axillary abscess Referring Physician: D. Elgergawy  Joy Nelson is an 41 y.o. female.  HPI: Patient transported to the ED yesterday by EMS, with a left axillary abscess. She reported prior abscess 3 weeks ago that opened on its own, the current site then presented. She reports new site came up about 3 days ago and is more painful than  the original site. She reports significant pain, fever with chills at home. Progressive erythema going down her left arm.  She does not have a thermometer at home. She reports 10 over 10 pain, fever chills decreased appetite and malaise.  Site underwent incision and drainage in the ED and packing with iodoform. One reason she came was because of the profound erythema going down her left arm medial aspect to her elbow. I do not see a wound culture, blood cultures are pending. Workup in the ED shows low-grade fever for 100.4, WBC was 17,000, creatinine 1.53.  No Imaging currently.  She continues to have significant pain, and tenderness, ongoing marked erythema, left medial arm. We were asked to see this a.m.  Past Medical History:  Diagnosis Date  . ADD (attention deficit disorder)   . Anxiety   . Back pain   . Cerebral aneurysm   . Chiari malformation   . Depression   . Fibromyalgia   . GERD (gastroesophageal reflux disease)   . Hypertension   . Interstitial cystitis   . Kidney stones   . Lupus   . Migraine   . Osteoarthritis   . Raynaud disease   . Seasonal allergies   . Sjogren's disease [facility])     Past Surgical History:  Procedure Laterality Date  . APPENDECTOMY    . BRAIN SURGERY  02/2014   pipeline stent  . CHOLECYSTECTOMY    . TONSILLECTOMY    . TUBAL LIGATION      Family History  Problem Relation Age of Onset  . Hypertension Mother   . Stroke Mother   . Migraines Mother   . Hypertension Father   . Migraines Father     Social History:  reports that she has been smoking Cigarettes.  She has been smoking  about 0.25 packs per day. She has never used smokeless tobacco. She reports that she drinks alcohol. She reports that she does not use drugs. Tobacco: Less than 1 pack per day. 5 year history. EtOH: Social Drugs: None She is unemployed/lives alone/attempting to get disability for chronic back issues.   Allergies:  Allergies  Allergen Reactions  . Methotrexate Derivatives Other (See Comments)    Flushing   . Celecoxib Swelling  . Morphine And Related Itching  . Other     Imitrex  . Penicillins Other (See Comments)    Childhood reaction.  Turns blue and has convulsions.  . Sulfa Antibiotics Swelling  . Tape Rash    Can have paper tape.     Prior to Admission medications   Medication Sig Start Date End Date Taking? Authorizing Provider  diphenhydrAMINE (BENADRYL) 25 mg capsule Take 25 mg by mouth at bedtime as needed for allergies.   Yes Historical Provider, MD  ipratropium (ATROVENT) 0.06 % nasal spray Place 2 sprays into both nostrils 4 (four) times daily. Patient taking differently: Place 2 sprays into both nostrils 2 (two) times daily.  11/25/15  Yes Billy Fischer, MD  lisinopril (PRINIVIL,ZESTRIL) 10 MG tablet Take 1 tablet (10 mg total) by mouth daily. 11/28/15  Yes Bjorn Pippin, PA-C  montelukast (SINGULAIR) 10 MG tablet Take 10 mg by mouth daily.    Yes Historical Provider, MD  omeprazole (PRILOSEC) 20 MG capsule Take 20 mg by mouth daily. 12/05/15  Yes Historical Provider, MD  propranolol ER (INDERAL LA) 80 MG 24 hr capsule Take 1 capsule (80 mg total) by mouth daily. 01/21/16  Yes Penni Bombard, MD  gabapentin (NEURONTIN) 100 MG capsule Take 100 mg by mouth 3 (three) times daily.    Historical Provider, MD     Results for orders placed or performed during the hospital encounter of 04/18/16 (from the past 48 hour(s))  Basic metabolic panel     Status: Abnormal   Collection Time: 04/18/16  7:56 AM  Result Value Ref Range   Sodium 136 135 - 145 mmol/L   Potassium 3.7  3.5 - 5.1 mmol/L   Chloride 105 101 - 111 mmol/L   CO2 20 (L) 22 - 32 mmol/L   Glucose, Bld 93 65 - 99 mg/dL   BUN 22 (H) 6 - 20 mg/dL   Creatinine, Ser 1.53 (H) 0.44 - 1.00 mg/dL   Calcium 9.1 8.9 - 10.3 mg/dL   GFR calc non Af Amer 42 (L) >60 mL/min   GFR calc Af Amer 48 (L) >60 mL/min    Comment: (NOTE) The eGFR has been calculated using the CKD EPI equation. This calculation has not been validated in all clinical situations. eGFR's persistently <60 mL/min signify possible Chronic Kidney Disease.    Anion gap 11 5 - 15  CBC with Differential     Status: Abnormal   Collection Time: 04/18/16  7:56 AM  Result Value Ref Range   WBC 17.0 (H) 4.0 - 10.5 K/uL   RBC 3.47 (L) 3.87 - 5.11 MIL/uL   Hemoglobin 11.2 (L) 12.0 - 15.0 g/dL   HCT 34.2 (L) 36.0 - 46.0 %   MCV 98.6 78.0 - 100.0 fL   MCH 32.3 26.0 - 34.0 pg   MCHC 32.7 30.0 - 36.0 g/dL   RDW 13.2 11.5 - 15.5 %   Platelets 269 150 - 400 K/uL   Neutrophils Relative % 78 %   Neutro Abs 13.4 (H) 1.7 - 7.7 K/uL   Lymphocytes Relative 9 %   Lymphs Abs 1.5 0.7 - 4.0 K/uL   Monocytes Relative 10 %   Monocytes Absolute 1.6 (H) 0.1 - 1.0 K/uL   Eosinophils Relative 3 %   Eosinophils Absolute 0.4 0.0 - 0.7 K/uL   Basophils Relative 0 %   Basophils Absolute 0.0 0.0 - 0.1 K/uL  I-Stat CG4 Lactic Acid, ED     Status: None   Collection Time: 04/18/16 10:50 AM  Result Value Ref Range   Lactic Acid, Venous 0.56 0.5 - 1.9 mmol/L  Blood Culture (routine x 2)     Status: None (Preliminary result)   Collection Time: 04/18/16 11:30 AM  Result Value Ref Range   Specimen Description BLOOD RIGHT ARM    Special Requests BOTTLES DRAWN AEROBIC AND ANAEROBIC 5CC    Culture PENDING    Report Status PENDING   MRSA PCR Screening     Status: None   Collection Time: 04/18/16  1:44 PM  Result Value Ref Range   MRSA by PCR NEGATIVE NEGATIVE    Comment:        The GeneXpert MRSA Assay (FDA approved for NASAL specimens only), is one component of  a comprehensive MRSA colonization surveillance program. It is not intended to diagnose MRSA infection nor to  guide or monitor treatment for MRSA infections.   Comprehensive metabolic panel     Status: Abnormal   Collection Time: 04/19/16  5:41 AM  Result Value Ref Range   Sodium 136 135 - 145 mmol/L   Potassium 4.2 3.5 - 5.1 mmol/L   Chloride 110 101 - 111 mmol/L   CO2 20 (L) 22 - 32 mmol/L   Glucose, Bld 94 65 - 99 mg/dL   BUN 11 6 - 20 mg/dL   Creatinine, Ser 0.68 0.44 - 1.00 mg/dL   Calcium 8.2 (L) 8.9 - 10.3 mg/dL   Total Protein 6.0 (L) 6.5 - 8.1 g/dL   Albumin 3.0 (L) 3.5 - 5.0 g/dL   AST 13 (L) 15 - 41 U/L   ALT 10 (L) 14 - 54 U/L   Alkaline Phosphatase 62 38 - 126 U/L   Total Bilirubin 0.3 0.3 - 1.2 mg/dL   GFR calc non Af Amer >60 >60 mL/min   GFR calc Af Amer >60 >60 mL/min    Comment: (NOTE) The eGFR has been calculated using the CKD EPI equation. This calculation has not been validated in all clinical situations. eGFR's persistently <60 mL/min signify possible Chronic Kidney Disease.    Anion gap 6 5 - 15  CBC     Status: Abnormal   Collection Time: 04/19/16  5:41 AM  Result Value Ref Range   WBC 10.8 (H) 4.0 - 10.5 K/uL   RBC 3.09 (L) 3.87 - 5.11 MIL/uL   Hemoglobin 10.2 (L) 12.0 - 15.0 g/dL   HCT 31.5 (L) 36.0 - 46.0 %   MCV 101.9 (H) 78.0 - 100.0 fL   MCH 33.0 26.0 - 34.0 pg   MCHC 32.4 30.0 - 36.0 g/dL   RDW 13.6 11.5 - 15.5 %   Platelets 252 150 - 400 K/uL    No results found.  Review of Systems  Constitutional: Positive for chills and fever. Negative for diaphoresis, malaise/fatigue and weight loss.  HENT:       Complains of trouble with vision and hearing  Eyes: Negative.   Respiratory: Negative.   Cardiovascular: Positive for palpitations and leg swelling.  Gastrointestinal: Negative.   Genitourinary:       She reports some trouble voiding and has a "narrowed urethra."  Musculoskeletal: Positive for back pain and neck pain.       She  has fibromyalgia that appears to affect her back primarily. She also has degenerative disc disease, and is  attempting to get on disability.  Skin:       Open site left axilla with erythema around the site and going down her left arm to the elbow. There is some fluctuance on exam of the left medial arm for about 4 cm going down her left arm that is adjacent to the open I&D site, but does not appear to be contiguous with it. I explored the site with an applicator stick after removing the current packing.  Neurological: Negative.  Negative for weakness.  Endo/Heme/Allergies: Negative.   Psychiatric/Behavioral: Positive for depression. The patient is nervous/anxious.        Also reports a history of bipolar disease.   Blood pressure 127/61, pulse 66, temperature 98.7 F (37.1 C), temperature source Oral, resp. rate 18, height _0  (1.575 m), weight 70.3 kg (155 lb), last menstrual period 04/12/2016, SpO2 99 %.    Physical Exam  Constitutional: She is oriented to person, place, and time. She appears well-developed and well-nourished. She appears distressed (Review  her left arm is extremely painful, exam caused her cry even with extra dilaudid).  HENT:  Head: Normocephalic and atraumatic.  Eyes: Right eye exhibits no discharge. Left eye exhibits no discharge. No scleral icterus.  Neck: Normal range of motion. Neck supple. No JVD present. No tracheal deviation present. No thyromegaly present.  Cardiovascular: Normal rate, regular rhythm, normal heart sounds and intact distal pulses.   No murmur heard. Respiratory: Effort normal and breath sounds normal. No respiratory distress. She has no wheezes. She has no rales. She exhibits no tenderness.  GI: Soft. Bowel sounds are normal. She exhibits no distension and no mass. There is no tenderness. There is no rebound and no guarding.  Musculoskeletal: She exhibits no edema or tenderness.  Lymphadenopathy:    She has no cervical adenopathy.   Neurological: She is alert and oriented to person, place, and time. No cranial nerve deficit.  Skin: Skin is warm and dry. She is not diaphoretic. There is erythema.  As noted in the picture below: The I&D site was open. There is purulent drainage from the site. The packing was removed. She has some erythema around the site but more significantly going down the medial left arm all the way to the elbow. This extremely tender but it feels like there is a fluctuant area in the left arm that is not contiguous with the open I&D site.  Psychiatric: She has a normal mood and affect. Her behavior is normal. Judgment and thought content normal.  Very anxious, tearful on exam which was very painful for her.      Assessment/Plan: Left axillary abscess with possible extension medial aspect of the left upper extremity Chronic back pain with degenerative disc disease. Fibromyalgia/history of interstitial cystitis History of cerebral aneurysm with stent placement 2015/Chiari malformation hx Reynaud's disease  Sjogren's disease ADD/Depression/Bipolar disease ID:  Clindamycin/Vancomycin FEN: NPO DVT:  Lovenox  Plan: She has been seen and chart reviewed by Dr. Hassell Done. We will get a left upper extremity, and shoulder CT. She is currently on clindamycin and vancomycin., We would continue that. I've ordered irrigations and dressing changes to the left upper extremity I&D site. If there is a fluid collection/abscess in the left upper extremity we would recommend orthopedic evaluation.  We will follow with you.    Caretha Rumbaugh 04/19/2016, 9:54 AM

## 2016-04-20 LAB — BASIC METABOLIC PANEL
ANION GAP: 9 (ref 5–15)
BUN: 10 mg/dL (ref 6–20)
CALCIUM: 8.5 mg/dL — AB (ref 8.9–10.3)
CO2: 23 mmol/L (ref 22–32)
Chloride: 105 mmol/L (ref 101–111)
Creatinine, Ser: 0.61 mg/dL (ref 0.44–1.00)
Glucose, Bld: 86 mg/dL (ref 65–99)
Potassium: 4.7 mmol/L (ref 3.5–5.1)
Sodium: 137 mmol/L (ref 135–145)

## 2016-04-20 LAB — CBC
HCT: 31.6 % — ABNORMAL LOW (ref 36.0–46.0)
HEMOGLOBIN: 10.6 g/dL — AB (ref 12.0–15.0)
MCH: 32.9 pg (ref 26.0–34.0)
MCHC: 33.5 g/dL (ref 30.0–36.0)
MCV: 98.1 fL (ref 78.0–100.0)
Platelets: 250 10*3/uL (ref 150–400)
RBC: 3.22 MIL/uL — AB (ref 3.87–5.11)
RDW: 12.9 % (ref 11.5–15.5)
WBC: 7.7 10*3/uL (ref 4.0–10.5)

## 2016-04-20 MED ORDER — DOCUSATE SODIUM 100 MG PO CAPS
100.0000 mg | ORAL_CAPSULE | Freq: Two times a day (BID) | ORAL | Status: DC | PRN
  Administered 2016-04-20 – 2016-04-23 (×2): 100 mg via ORAL
  Filled 2016-04-20 (×2): qty 1

## 2016-04-20 MED ORDER — LISINOPRIL 10 MG PO TABS
10.0000 mg | ORAL_TABLET | Freq: Every day | ORAL | Status: DC
  Administered 2016-04-20 – 2016-04-24 (×5): 10 mg via ORAL
  Filled 2016-04-20 (×5): qty 1

## 2016-04-20 NOTE — Progress Notes (Signed)
Patient ID: Joy Nelson, female   DOB: February 02, 1976, 41 y.o.   MRN: [id number]  [facility]     Subjective: Persistent pain in left upper extremity. States that wound was not packed well yesterday. She is tolerating vancomycin and aztreoman.  CT showed cellulitis, myositis, and no drainable fluid collection.  Objective: Vital signs in last 24 hours: Temp:  [98.2 F (36.8 C)-98.7 F (37.1 C)] 98.5 F (36.9 C) (01/23 1040) Pulse Rate:  [57-70] 63 (01/23 1040) Resp:  [18-20] 19 (01/23 0536) BP: (125-148)/(68-79) 148/78 (01/23 1040) SpO2:  [99 %-100 %] 100 % (01/23 1040) Last BM Date: 04/17/16  Intake/Output from previous day: 01/22 0701 - 01/23 0700 In: 2580 [P.O.:25; I.V.:2205; IV Piggyback:350] Out: 3600 [Urine:3600] Intake/Output this shift: Total I/O In: -  Out: 700 [Urine:700]  PE: Gen:  Alert, NAD, pleasant Card:  RRR, no M/G/R heard Pulm:  CTAB, no W/R/R, effort normal Abd: Soft, NT/ND, +BS, no HSM LUE: abscess near left axilla s/p I&D with trace serous drainage, diffuse erythema surrounding wound and extending down anterior upper arm to elbow and into the proximal forearm  Lab Results:   Recent Labs  04/19/16 0541 04/20/16 0542  WBC 10.8* 7.7  HGB 10.2* 10.6*  HCT 31.5* 31.6*  PLT 252 250   BMET  Recent Labs  04/19/16 0541 04/20/16 0542  NA 136 137  K 4.2 4.7  CL 110 105  CO2 20* 23  GLUCOSE 94 86  BUN 11 10  CREATININE 0.68 0.61  CALCIUM 8.2* 8.5*   PT/INR No results for input(s): LABPROT, INR in the last 72 hours. CMP     Component Value Date/Time   NA 137 04/20/2016 0542   K 4.7 04/20/2016 0542   CL 105 04/20/2016 0542   CO2 23 04/20/2016 0542   GLUCOSE 86 04/20/2016 0542   BUN 10 04/20/2016 0542   CREATININE 0.61 04/20/2016 0542   CALCIUM 8.5 (L) 04/20/2016 0542   PROT 6.0 (L) 04/19/2016 0541   ALBUMIN 3.0 (L) 04/19/2016 0541   AST 13 (L) 04/19/2016 0541   ALT 10 (L) 04/19/2016 0541   ALKPHOS 62  04/19/2016 0541   BILITOT 0.3 04/19/2016 0541   GFRNONAA >60 04/20/2016 0542   GFRAA >60 04/20/2016 0542   Lipase  No results found for: LIPASE     Studies/Results: Ct Humerus Left W Contrast  Result Date: 04/19/2016 CLINICAL DATA:  Hypertension and shortness disease with left axillary pain. Workup significant for left axillary abscess status post incision and drainage in the ED. Concern for abscess in the arm. EXAM: CT OF THE UPPER LEFT EXTREMITY WITH CONTRAST TECHNIQUE: Multidetector CT imaging of the upper left extremity was performed according to the standard protocol following intravenous contrast administration. COMPARISON:  None. CONTRAST:  100mL ISOVUE-300 IOPAMIDOL (ISOVUE-300) INJECTION 61%<Contrast>13200mL ISOVUE-300 IOPAMIDOL (ISOVUE-300) INJECTION 61% FINDINGS: Bones/Joint/Cartilage Nonacute.  No bone destruction. Ligaments Suboptimally assessed by CT. Muscles and Tendons There soft tissue induration of the subcutaneous fat from left axilla and coursing over the biceps muscle. Mild reactive edematous change of the biceps muscle without drainable fluid collection or abnormal enhancement identified. Soft tissues Cellulitis of the left axilla tracking along the ventral and medial aspect of the left arm without soft tissue abscess identified. Small reactive lymph nodes in the left axilla measuring up to 6 mm short axis. IMPRESSION: No drainable fluid collections of the left arm. Cellulitis from left axilla to elbow joint with probable mild myositis of the biceps but no pyomyositis.  No acute fracture nor bone destruction.  The Electronically Signed   By: Tollie Eth M.D.   On: 04/19/2016 20:43    Anti-infectives: Anti-infectives    Start     Dose/Rate Route Frequency Ordered Stop   04/19/16 1800  vancomycin (VANCOCIN) IVPB 1000 mg/200 mL premix     1,000 mg 200 mL/hr over 60 Minutes Intravenous Every 12 hours 04/19/16 1048     04/19/16 1400  aztreonam (AZACTAM) 1 g in dextrose 5 % 50 mL  IVPB  Status:  Discontinued     1 g 100 mL/hr over 30 Minutes Intravenous Every 8 hours 04/19/16 1231 04/19/16 1318   04/19/16 1400  aztreonam (AZACTAM) 1 GM IVPB     1 g 100 mL/hr over 30 Minutes Intravenous Every 8 hours 04/19/16 1318     04/18/16 2200  vancomycin (VANCOCIN) 500 mg in sodium chloride 0.9 % 100 mL IVPB  Status:  Discontinued     500 mg 100 mL/hr over 60 Minutes Intravenous Every 12 hours 04/18/16 0946 04/19/16 1048   04/18/16 1300  vancomycin (VANCOCIN) IVPB 1000 mg/200 mL premix  Status:  Discontinued     1,000 mg 200 mL/hr over 60 Minutes Intravenous  Once 04/18/16 1253 04/18/16 1302   04/18/16 0930  vancomycin (VANCOCIN) IVPB 1000 mg/200 mL premix     1,000 mg 200 mL/hr over 60 Minutes Intravenous  Once 04/18/16 0920 04/18/16 1333   04/18/16 0645  clindamycin (CLEOCIN) IVPB 900 mg     900 mg 100 mL/hr over 30 Minutes Intravenous  Once 04/18/16 1610 04/18/16 9604       Assessment/Plan Left axillary abscess and cellulitis - s/p I&D in ED 1/21, no cultures sent - CT shows no drainable fluid collections of the left arm. Cellulitis from left axilla to elbow joint with probable mild myositis of the biceps but no pyomyositis - WBC trending down (7.7), afebrile over night  Chronic back pain with degenerative disc disease. Fibromyalgia/history of interstitial cystitis History of cerebral aneurysm with stent placement 2015/Chiari malformation hx Reynaud's disease  Sjogren's disease ADD/Depression/Bipolar disease GERD HTN  ID:  vancomycin 1/21>>, aztreoman 1/22>> FEN: regular diet DVT:  lovenox, SCDs  Plan: CT shows no drainage fluid collection, no further debridement needed. Continue BID dressing changes/packing with iodoform. Ok to shower with wound open. Continue IV antibiotics. Will continue to follow. Blood cultures pending.    LOS: 2 days    Edson Snowball , [facility] 04/20/2016, 11:05 AM Pager: [phone] Consults:  [phone] Mon-Fri 7:00 am-4:30 pm Sat-Sun 7:00 am-11:30 am

## 2016-04-20 NOTE — Progress Notes (Signed)
PROGRESS NOTE                                                                                                                                                                                                             Patient Demographics:    Joy Nelson, is a 41 y.o. female, DOB - 11-03-1975, [id number]  Admit date - 04/18/2016   Admitting Physician Mauricio Annett Gulaaniel Arrien, MD  Outpatient Primary MD for the patient is BLUNT-EVANS, LACHELLE A, NP  LOS - 2    Chief Complaint  Patient presents with  . Abscess       Brief Narrative  41 y.o. female with medical history significant of hypertension and sjogren's disease who presents with left axillary pain, workup significant for left axillary abscess, status post I&D in ED.   Subjective:    Joy JuneAmanda Vecchione today has, No headache, No chest pain, No abdominal pain - No Nausea, Planes of significant pain at left axillary area.   Assessment  & Plan :    Active Problems:   Cellulitis of arm, left   Abscess   Sepsis, due to abscesses and cellulitis. - she does have fever and she did develop hypotension.With leukocytosis. - S/P I&D in ED, unfortunately no wound cultures sent, continue with IV vancomycin, and IV aztreonam (penicillin allergy) - Follow blood cultures, NGTD - Surgery consult appreciated, CT shows no drainage fluid collection, no further debridement needed, [place] for myositis , so continue with IV antibiotics and wound care.  Acute kidney injury.  -Probably prerenal, associated with cellulitis and sepsis syndrome, resolved with IV fluid  Hypertension.  - Will continue propanolol, resume back on lisinopril given increase in blood pressure .  GERD.  - Continue omeprazole.    Code Status : Full  Family Communication  : None at edside  Disposition Plan  : Home when stable  Consults  :  Gen surgery  Procedures  : I&D in ED  DVT Prophylaxis  :  Lovenox - SCDs   Lab  Results  Component Value Date   PLT 250 04/20/2016    Antibiotics  :    Anti-infectives    Start     Dose/Rate Route Frequency Ordered Stop   04/19/16 1800  vancomycin (VANCOCIN) IVPB 1000 mg/200 mL premix     1,000 mg 200 mL/hr over 60 Minutes  Intravenous Every 12 hours 04/19/16 1048     04/19/16 1400  aztreonam (AZACTAM) 1 g in dextrose 5 % 50 mL IVPB  Status:  Discontinued     1 g 100 mL/hr over 30 Minutes Intravenous Every 8 hours 04/19/16 1231 04/19/16 1318   04/19/16 1400  aztreonam (AZACTAM) 1 GM IVPB     1 g 100 mL/hr over 30 Minutes Intravenous Every 8 hours 04/19/16 1318     04/18/16 2200  vancomycin (VANCOCIN) 500 mg in sodium chloride 0.9 % 100 mL IVPB  Status:  Discontinued     500 mg 100 mL/hr over 60 Minutes Intravenous Every 12 hours 04/18/16 0946 04/19/16 1048   04/18/16 1300  vancomycin (VANCOCIN) IVPB 1000 mg/200 mL premix  Status:  Discontinued     1,000 mg 200 mL/hr over 60 Minutes Intravenous  Once 04/18/16 1253 04/18/16 1302   04/18/16 0930  vancomycin (VANCOCIN) IVPB 1000 mg/200 mL premix     1,000 mg 200 mL/hr over 60 Minutes Intravenous  Once 04/18/16 0920 04/18/16 1333   04/18/16 0645  clindamycin (CLEOCIN) IVPB 900 mg     900 mg 100 mL/hr over 30 Minutes Intravenous  Once 04/18/16 0637 04/18/16 0821        Objective:   Vitals:   04/19/16 1359 04/19/16 2054 04/20/16 0536 04/20/16 1040  BP: 135/79 (!) 142/70 125/68 (!) 148/78  Pulse: 60 70 (!) 57 63  Resp: 20 18 19    Temp: 98.7 F (37.1 C) 98.7 F (37.1 C) 98.2 F (36.8 C) 98.5 F (36.9 C)  TempSrc: Oral Oral Oral Oral  SpO2: 100% 99% 99% 100%  Weight:      Height:        Wt Readings from Last 3 Encounters:  04/18/16 70.3 kg (155 lb)  01/21/16 72.8 kg (160 lb 6.4 oz)  12/19/15 74.8 kg (165 lb)     Intake/Output Summary (Last 24 hours) at 04/20/16 1322 Last data filed at 04/20/16 1027  Gross per 24 hour  Intake             2555 ml  Output             4000 ml  Net            -1445  ml     Physical Exam  Awake Alert, Oriented X 3, Supple Neck,No JVD,  Symmetrical Chest wall movement, Good air movement bilaterally, CTAB RRR,No Gallops,Rubs or new Murmurs, No Parasternal Heave +ve B.Sounds, Abd Soft, No tenderness , No rebound - guarding or rigidity. No Cyanosis, Clubbing or edema, in  bilateral lower extremity, left upper extremity including the axillary and upper arm area of significant erythema and swelling(with slight subsiding from the demarcated line today).    Data Review:    CBC  Recent Labs Lab 04/18/16 0756 04/19/16 0541 04/20/16 0542  WBC 17.0* 10.8* 7.7  HGB 11.2* 10.2* 10.6*  HCT 34.2* 31.5* 31.6*  PLT 269 252 250  MCV 98.6 101.9* 98.1  MCH 32.3 33.0 32.9  MCHC 32.7 32.4 33.5  RDW 13.2 13.6 12.9  LYMPHSABS 1.5  --   --   MONOABS 1.6*  --   --   EOSABS 0.4  --   --   BASOSABS 0.0  --   --     Chemistries   Recent Labs Lab 04/18/16 0756 04/19/16 0541 04/20/16 0542  NA 136 136 137  K 3.7 4.2 4.7  CL 105 110 105  CO2 20* 20* 23  GLUCOSE 93 94 86  BUN 22* 11 10  CREATININE 1.53* 0.68 0.61  CALCIUM 9.1 8.2* 8.5*  AST  --  13*  --   ALT  --  10*  --   ALKPHOS  --  62  --   BILITOT  --  0.3  --    ------------------------------------------------------------------------------------------------------------------ No results for input(s): CHOL, HDL, LDLCALC, TRIG, CHOLHDL, LDLDIRECT in the last 72 hours.  No results found for: HGBA1C ------------------------------------------------------------------------------------------------------------------ No results for input(s): TSH, T4TOTAL, T3FREE, THYROIDAB in the last 72 hours.  Invalid input(s): FREET3 ------------------------------------------------------------------------------------------------------------------ No results for input(s): VITAMINB12, FOLATE, FERRITIN, TIBC, IRON, RETICCTPCT in the last 72 hours.  Coagulation profile No results for input(s): INR, PROTIME in the  last 168 hours.  No results for input(s): DDIMER in the last 72 hours.  Cardiac Enzymes No results for input(s): CKMB, TROPONINI, MYOGLOBIN in the last 168 hours.  Invalid input(s): CK ------------------------------------------------------------------------------------------------------------------ No results found for: BNP  Inpatient Medications  Scheduled Meds: . aztreonam  1 g Intravenous Q8H  . enoxaparin (LOVENOX) injection  40 mg Subcutaneous Daily  . gabapentin  100 mg Oral TID  . Influenza vac split quadrivalent PF  0.5 mL Intramuscular Tomorrow-1000  . ipratropium  2 spray Each Nare BID  . montelukast  10 mg Oral Daily  . pantoprazole  40 mg Oral Daily  . propranolol ER  80 mg Oral Daily  . sodium chloride flush  3 mL Intravenous Q12H  . vancomycin  1,000 mg Intravenous Q12H   Continuous Infusions: . sodium chloride 100 mL/hr at 04/20/16 1027   PRN Meds:.acetaminophen **OR** acetaminophen, diphenhydrAMINE, docusate sodium, HYDROmorphone (DILAUDID) injection, ondansetron **OR** ondansetron (ZOFRAN) IV, traMADol  Micro Results Recent Results (from the past 240 hour(s))  Blood Culture (routine x 2)     Status: None (Preliminary result)   Collection Time: 04/18/16 11:30 AM  Result Value Ref Range Status   Specimen Description BLOOD RIGHT ARM  Final   Special Requests BOTTLES DRAWN AEROBIC AND ANAEROBIC 5CC  Final   Culture   Final    NO GROWTH 1 DAY Performed at [facility], [address], [place], [place] [address]    Report Status PENDING  Incomplete  MRSA PCR Screening     Status: None   Collection Time: 04/18/16  1:44 PM  Result Value Ref Range Status   MRSA by PCR NEGATIVE NEGATIVE Final    Comment:        The GeneXpert MRSA Assay (FDA approved for NASAL specimens only), is one component of a comprehensive MRSA colonization surveillance program. It is not intended to diagnose MRSA infection nor to guide or monitor treatment for MRSA  infections.     Radiology Reports Ct Humerus Left W Contrast  Result Date: 04/19/2016 CLINICAL DATA:  Hypertension and shortness disease with left axillary pain. Workup significant for left axillary abscess status post incision and drainage in the ED. Concern for abscess in the arm. EXAM: CT OF THE UPPER LEFT EXTREMITY WITH CONTRAST TECHNIQUE: Multidetector CT imaging of the upper left extremity was performed according to the standard protocol following intravenous contrast administration. COMPARISON:  None. CONTRAST:  ISOVUE-300 IOPAMIDOL (ISOVUE-300) INJECTION 61%<Contrast>185mL ISOVUE-300 IOPAMIDOL (ISOVUE-300) INJECTION 61% FINDINGS: Bones/Joint/Cartilage Nonacute.  No bone destruction. Ligaments Suboptimally assessed by CT. Muscles and Tendons There soft tissue induration of the subcutaneous fat from left axilla and coursing over the biceps muscle. Mild reactive edematous change of the biceps muscle without drainable fluid collection or abnormal enhancement  identified. Soft tissues Cellulitis of the left axilla tracking along the ventral and medial aspect of the left arm without soft tissue abscess identified. Small reactive lymph nodes in the left axilla measuring up to 6 mm short axis. IMPRESSION: No drainable fluid collections of the left arm. Cellulitis from left axilla to elbow joint with probable mild myositis of the biceps but no pyomyositis. No acute fracture nor bone destruction.  The Electronically Signed   By: Tollie Eth M.D.   On: 04/19/2016 20:43      Randol Kern, Bhavana Kady M.D on 04/20/2016 at 1:22 PM  Between 7am to 7pm - Pager - [phone]  After 7pm go to [email or web address] - password [facility]  [facility] -  Office  [phone]

## 2016-04-21 DIAGNOSIS — L03114 Cellulitis of left upper limb: Secondary | ICD-10-CM

## 2016-04-21 DIAGNOSIS — L0291 Cutaneous abscess, unspecified: Secondary | ICD-10-CM

## 2016-04-21 LAB — BASIC METABOLIC PANEL
Anion gap: 8 (ref 5–15)
BUN: 10 mg/dL (ref 6–20)
CALCIUM: 9.3 mg/dL (ref 8.9–10.3)
CHLORIDE: 100 mmol/L — AB (ref 101–111)
CO2: 29 mmol/L (ref 22–32)
CREATININE: 0.72 mg/dL (ref 0.44–1.00)
GFR calc non Af Amer: >60 mL/min (ref >60)
Glucose, Bld: 93 mg/dL (ref 65–99)
Potassium: 4.1 mmol/L (ref 3.5–5.1)
SODIUM: 137 mmol/L (ref 135–145)

## 2016-04-21 LAB — VANCOMYCIN, TROUGH: VANCOMYCIN TR: 11 ug/mL — AB (ref 15–20)

## 2016-04-21 LAB — CBC
HCT: 34.3 % — ABNORMAL LOW (ref 36.0–46.0)
Hemoglobin: 11.1 g/dL — ABNORMAL LOW (ref 12.0–15.0)
MCH: 32.1 pg (ref 26.0–34.0)
MCHC: 32.4 g/dL (ref 30.0–36.0)
MCV: 99.1 fL (ref 78.0–100.0)
PLATELETS: 327 10*3/uL (ref 150–400)
RBC: 3.46 MIL/uL — AB (ref 3.87–5.11)
RDW: 13 % (ref 11.5–15.5)
WBC: 6 10*3/uL (ref 4.0–10.5)

## 2016-04-21 NOTE — Care Management Note (Signed)
Case Management Note  Patient Details  Name: Joy Nelson MRN: [id number] Date of Birth: 10-27-1975  Subjective/Objective: 41 y/o f admitted w/L arm cellulitis. From home.                   Action/Plan:d/c home.   Expected Discharge Date:                  Expected Discharge Plan:  Home/Self Care  In-House Referral:     Discharge planning Services  CM Consult  Post Acute Care Choice:    Choice offered to:     DME Arranged:    DME Agency:     HH Arranged:    HH Agency:     Status of Service:  In process, will continue to follow  If discussed at Long Length of Stay Meetings, dates discussed:    Additional Comments:  Lanier ClamMahabir, Temprance Wyre, RN 04/21/2016, 2:00 PM

## 2016-04-21 NOTE — Progress Notes (Signed)
Subjective: Much improved, she does have some redness and swelling that is over the same area that I was concerned about when I examined her a couple days ago.  That is the area that led [place] to doing CT.  A majority of the arm is markedly improved.  Erythema going down to elbow is much better.  Objective: Vital signs in last 24 hours: Temp:  [98.5 F (36.9 C)-99.3 F (37.4 C)] 99.3 F (37.4 C) (01/23 2013) Pulse Rate:  [62-63] 63 (01/23 2013) Resp:  [20] 20 (01/23 2013) BP: (134-151)/(73-78) 151/73 (01/23 2013) SpO2:  [97 %-100 %] 100 % (01/23 2013) Last BM Date: 04/20/16 2800 IV Urine 1825 Stool x 1 Afebrile, VSS WBC is normal CT:  No drainable fluid collections of the left arm. Cellulitis from left axilla to elbow joint with probable mild myositis of the biceps but no pyomyositis. Intake/Output from previous day: 01/23 0701 - 01/24 0700 In: 2853 [I.V.:2353; IV Piggyback:500] Out: 1825 [Urine:1825] Intake/Output this shift: No intake/output data recorded.  General appearance: alert, cooperative and no distress GI: Arm is much better, she still has one area of concern and that is adjacent to the origninal I&D site.  Continue to watch and continue antibiotics.    Lab Results:   Recent Labs  04/20/16 0542 04/21/16 0537  WBC 7.7 6.0  HGB 10.6* 11.1*  HCT 31.6* 34.3*  PLT 250 327    BMET  Recent Labs  04/20/16 0542 04/21/16 0537  NA 137 137  K 4.7 4.1  CL 105 100*  CO2 23 29  GLUCOSE 86 93  BUN 10 10  CREATININE 0.61 0.72  CALCIUM 8.5* 9.3   PT/INR No results for input(s): LABPROT, INR in the last 72 hours.   Recent Labs Lab 04/19/16 0541  AST 13*  ALT 10*  ALKPHOS 62  BILITOT 0.3  PROT 6.0*  ALBUMIN 3.0*     Lipase  No results found for: LIPASE   Studies/Results: Ct Humerus Left W Contrast  Result Date: 04/19/2016 CLINICAL DATA:  Hypertension and shortness disease with left axillary pain. Workup significant for left axillary abscess  status post incision and drainage in the ED. Concern for abscess in the arm. EXAM: CT OF THE UPPER LEFT EXTREMITY WITH CONTRAST TECHNIQUE: Multidetector CT imaging of the upper left extremity was performed according to the standard protocol following intravenous contrast administration. COMPARISON:  None. CONTRAST:  ISOVUE-300 IOPAMIDOL (ISOVUE-300) INJECTION 61%<Contrast>192mL ISOVUE-300 IOPAMIDOL (ISOVUE-300) INJECTION 61% FINDINGS: Bones/Joint/Cartilage Nonacute.  No bone destruction. Ligaments Suboptimally assessed by CT. Muscles and Tendons There soft tissue induration of the subcutaneous fat from left axilla and coursing over the biceps muscle. Mild reactive edematous change of the biceps muscle without drainable fluid collection or abnormal enhancement identified. Soft tissues Cellulitis of the left axilla tracking along the ventral and medial aspect of the left arm without soft tissue abscess identified. Small reactive lymph nodes in the left axilla measuring up to 6 mm short axis. IMPRESSION: No drainable fluid collections of the left arm. Cellulitis from left axilla to elbow joint with probable mild myositis of the biceps but no pyomyositis. No acute fracture nor bone destruction.  The Electronically Signed   By: Tollie Eth M.D.   On: 04/19/2016 20:43    Medications: . aztreonam  1 g Intravenous Q8H  . enoxaparin (LOVENOX) injection  40 mg Subcutaneous Daily  . gabapentin  100 mg Oral TID  . Influenza vac split quadrivalent PF  0.5 mL Intramuscular Tomorrow-1000  .  ipratropium  2 spray Each Nare BID  . lisinopril  10 mg Oral Daily  . montelukast  10 mg Oral Daily  . pantoprazole  40 mg Oral Daily  . propranolol ER  80 mg Oral Daily  . sodium chloride flush  3 mL Intravenous Q12H  . vancomycin  1,000 mg Intravenous Q12H   . sodium chloride 100 mL/hr at 04/21/16 0846     Assessment/Plan Left axillary abscess with possible extension medial aspect of the left upper  extremity Chronic back pain with degenerative disc disease. 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  The remainder of the arm is 90% improved.  Continue antibiotics and observe.      LOS: 3 days    Joy Nelson 04/21/2016 [phone]

## 2016-04-21 NOTE — Progress Notes (Signed)
PROGRESS NOTE    Joy Nelson   ZOX:[id number]  DOB: Feb 01, 1976  DOA: 04/18/2016 PCP: Daune Perch, NP   Brief Narrative:  41 y.o.femalewith medical history significant of hypertension and sjogren's disease who presents with left axillary pain, workup significant for left axillary abscess, status post I&D in ED.   Subjective: Pain in left axilla  Assessment & Plan: Sepsis, due to abscesses and cellulitis. -   fever and hypotension with leukocytosis. - S/P I&D in ED, unfortunately no wound cultures sent, continue with IV vancomycin, and IV aztreonam (penicillin allergy) - now has new swelling in axilla which is quite tender - blood cultures- NGTD - Surgery consult appreciated   Acute kidney injury.  -Probably prerenal, associated with cellulitis and sepsis syndrome, resolved with IV fluid  Hypertension.  - Will continue propanolol & lisinopril   GERD.  - Continue omeprazole.   DVT prophylaxis: Lovenox Code Status: Full code Family Communication:  Disposition Plan: home when stable Consultants:   Gen sugery Procedures:   I and D Antimicrobials:  Anti-infectives    Start     Dose/Rate Route Frequency Ordered Stop   04/19/16 1800  vancomycin (VANCOCIN) IVPB 1000 mg/200 mL premix     1,000 mg 200 mL/hr over 60 Minutes Intravenous Every 12 hours 04/19/16 1048     04/19/16 1400  aztreonam (AZACTAM) 1 g in dextrose 5 % 50 mL IVPB  Status:  Discontinued     1 g 100 mL/hr over 30 Minutes Intravenous Every 8 hours 04/19/16 1231 04/19/16 1318   04/19/16 1400  aztreonam (AZACTAM) 1 GM IVPB     1 g 100 mL/hr over 30 Minutes Intravenous Every 8 hours 04/19/16 1318     04/18/16 2200  vancomycin (VANCOCIN) 500 mg in sodium chloride 0.9 % 100 mL IVPB  Status:  Discontinued     500 mg 100 mL/hr over 60 Minutes Intravenous Every 12 hours 04/18/16 0946 04/19/16 1048   04/18/16 1300  vancomycin (VANCOCIN) IVPB 1000 mg/200 mL premix  Status:  Discontinued     1,000 mg 200 mL/hr over 60 Minutes Intravenous  Once 04/18/16 1253 04/18/16 1302   04/18/16 0930  vancomycin (VANCOCIN) IVPB 1000 mg/200 mL premix     1,000 mg 200 mL/hr over 60 Minutes Intravenous  Once 04/18/16 0920 04/18/16 1333   04/18/16 0645  clindamycin (CLEOCIN) IVPB 900 mg     900 mg 100 mL/hr over 30 Minutes Intravenous  Once 04/18/16 0637 04/18/16 0821       Objective: Vitals:   04/20/16 0536 04/20/16 1040 04/20/16 1436 04/20/16 2013  BP: 125/68 (!) 148/78 134/75 (!) 151/73  Pulse: (!) 57 63 62 63  Resp: 19   20  Temp: 98.2 F (36.8 C) 98.5 F (36.9 C) 98.9 F (37.2 C) 99.3 F (37.4 C)  TempSrc: Oral Oral Oral Oral  SpO2: 99% 100% 97% 100%  Weight:      Height:        Intake/Output Summary (Last 24 hours) at 04/21/16 1433 Last data filed at 04/21/16 0600  Gross per 24 hour  Intake             2853 ml  Output             1025 ml  Net             1828 ml   Filed Weights   04/18/16 0333  Weight: 70.3 kg (155 lb)    Examination: General exam: Appears comfortable  HEENT:  PERRLA, oral mucosa moist, no sclera icterus or thrush Respiratory system: Clear to auscultation. Respiratory effort normal. Cardiovascular system: S1 & S2 heard, RRR.  No murmurs  Gastrointestinal system: Abdomen soft, non-tender, nondistended. Normal bowel sound. No organomegaly Central nervous system: Alert and oriented. No focal neurological deficits. Extremities: No cyanosis, clubbing or edema Skin: cellulitis and abscess which is currently draining- fluctuant tender area on lateral aspect of axilla Psychiatry:  Mood & affect appropriate.     Data Reviewed: I have personally reviewed following labs and imaging studies  CBC:  Recent Labs Lab 04/18/16 0756 04/19/16 0541 04/20/16 0542 04/21/16 0537  WBC 17.0* 10.8* 7.7 6.0  NEUTROABS 13.4*  --   --   --   HGB 11.2* 10.2* 10.6* 11.1*  HCT 34.2* 31.5* 31.6* 34.3*  MCV 98.6 101.9* 98.1 99.1  PLT 269 252 250 327   Basic  Metabolic Panel:  Recent Labs Lab 04/18/16 0756 04/19/16 0541 04/20/16 0542 04/21/16 0537  NA 136 136 137 137  K 3.7 4.2 4.7 4.1  CL 105 110 105 100*  CO2 20* 20* 23 29  GLUCOSE 93 94 86 93  BUN 22* 11 10 10   CREATININE 1.53* 0.68 0.61 0.72  CALCIUM 9.1 8.2* 8.5* 9.3   GFR: Estimated Creatinine Clearance: 85.9 mL/min (by C-G formula based on SCr of 0.72 mg/dL). Liver Function Tests:  Recent Labs Lab 04/19/16 0541  AST 13*  ALT 10*  ALKPHOS 62  BILITOT 0.3  PROT 6.0*  ALBUMIN 3.0*   No results for input(s): LIPASE, AMYLASE in the last 168 hours. No results for input(s): AMMONIA in the last 168 hours. Coagulation Profile: No results for input(s): INR, PROTIME in the last 168 hours. Cardiac Enzymes: No results for input(s): CKTOTAL, CKMB, CKMBINDEX, TROPONINI in the last 168 hours. BNP (last 3 results) No results for input(s): PROBNP in the last 8760 hours. HbA1C: No results for input(s): HGBA1C in the last 72 hours. CBG: No results for input(s): GLUCAP in the last 168 hours. Lipid Profile: No results for input(s): CHOL, HDL, LDLCALC, TRIG, CHOLHDL, LDLDIRECT in the last 72 hours. Thyroid Function Tests: No results for input(s): TSH, T4TOTAL, FREET4, T3FREE, THYROIDAB in the last 72 hours. Anemia Panel: No results for input(s): VITAMINB12, FOLATE, FERRITIN, TIBC, IRON, RETICCTPCT in the last 72 hours. Urine analysis: No results found for: COLORURINE, APPEARANCEUR, LABSPEC, PHURINE, GLUCOSEU, HGBUR, BILIRUBINUR, KETONESUR, PROTEINUR, UROBILINOGEN, NITRITE, LEUKOCYTESUR Sepsis Labs: @LABRCNTIP (procalcitonin:4,lacticidven:4) ) Recent Results (from the past 240 hour(s))  Blood Culture (routine x 2)     Status: None (Preliminary result)   Collection Time: 04/18/16 11:30 AM  Result Value Ref Range Status   Specimen Description BLOOD RIGHT ARM  Final   Special Requests BOTTLES DRAWN AEROBIC AND ANAEROBIC 5CC  Final   Culture   Final    NO GROWTH 2 DAYS Performed at  [facility], [address] [place], [place] [id number]    Report Status PENDING  Incomplete  MRSA PCR Screening     Status: None   Collection Time: 04/18/16  1:44 PM  Result Value Ref Range Status   MRSA by PCR NEGATIVE NEGATIVE Final    Comment:        The GeneXpert MRSA Assay (FDA approved for NASAL specimens only), is one component of a comprehensive MRSA colonization surveillance program. It is not intended to diagnose MRSA infection nor to guide or monitor treatment for MRSA infections.   Blood Culture (routine x 2)     Status:  None (Preliminary result)   Collection Time: 04/19/16  5:41 AM  Result Value Ref Range Status   Specimen Description BLOOD LEFT HAND  Final   Special Requests IN PEDIATRIC BOTTLE 2.5 CC  Final   Culture   Final    NO GROWTH 1 DAY Performed at [facility], [address] [place], [place] [address]    Report Status PENDING  Incomplete         Radiology Studies: Ct Humerus Left W Contrast  Result Date: 04/19/2016 CLINICAL DATA:  Hypertension and shortness disease with left axillary pain. Workup significant for left axillary abscess status post incision and drainage in the ED. Concern for abscess in the arm. EXAM: CT OF THE UPPER LEFT EXTREMITY WITH CONTRAST TECHNIQUE: Multidetector CT imaging of the upper left extremity was performed according to the standard protocol following intravenous contrast administration. COMPARISON:  None. CONTRAST:  ISOVUE-300 IOPAMIDOL (ISOVUE-300) INJECTION 61%<Contrast>167mL ISOVUE-300 IOPAMIDOL (ISOVUE-300) INJECTION 61% FINDINGS: Bones/Joint/Cartilage Nonacute.  No bone destruction. Ligaments Suboptimally assessed by CT. Muscles and Tendons There soft tissue induration of the subcutaneous fat from left axilla and coursing over the biceps muscle. Mild reactive edematous change of the biceps muscle without drainable fluid collection or abnormal enhancement identified. Soft tissues Cellulitis of the  left axilla tracking along the ventral and medial aspect of the left arm without soft tissue abscess identified. Small reactive lymph nodes in the left axilla measuring up to 6 mm short axis. IMPRESSION: No drainable fluid collections of the left arm. Cellulitis from left axilla to elbow joint with probable mild myositis of the biceps but no pyomyositis. No acute fracture nor bone destruction.  The Electronically Signed   By: Tollie Eth M.D.   On: 04/19/2016 20:43      Scheduled Meds: . aztreonam  1 g Intravenous Q8H  . enoxaparin (LOVENOX) injection  40 mg Subcutaneous Daily  . gabapentin  100 mg Oral TID  . Influenza vac split quadrivalent PF  0.5 mL Intramuscular Tomorrow-1000  . ipratropium  2 spray Each Nare BID  . lisinopril  10 mg Oral Daily  . montelukast  10 mg Oral Daily  . pantoprazole  40 mg Oral Daily  . propranolol ER  80 mg Oral Daily  . sodium chloride flush  3 mL Intravenous Q12H  . vancomycin  1,000 mg Intravenous Q12H   Continuous Infusions: . sodium chloride 100 mL/hr at 04/21/16 0846     LOS: 3 days    Time spent in minutes: 35    Lyann Hagstrom, MD [facility] Pager: [email or web address] Password [facility] 04/21/2016, 2:33 PM

## 2016-04-21 NOTE — ED Provider Notes (Signed)
[facility]-EMERGENCY DEPT Provider Note   CSN: [id number] Arrival date & time: 04/18/16  0250     History   Chief Complaint Chief Complaint  Patient presents with  . Abscess    HPI Joy Nelson is a 41 y.o. female.  HPI Patient presents to the emergency department with abscess and cellulitis of the left axilla The patient states that she noticed this area developed in the last 3 days.  The patient states that he did have an area that ruptured 3 weeks ago.  She has had some fever and nausea   Patient states that nothing seems to make the condition better.  Palpation and movement make the pain worseThe patient denies chest pain, shortness of breath, headache,blurred vision, neck pain,  cough, weakness, numbness, dizziness, anorexia, edema, abdominal pain, vomiting, diarrhea, rash, back pain, dysuria, hematemesis, bloody stool, near syncope, or syncope. Past Medical History:  Diagnosis Date  . ADD (attention deficit disorder)   . Anxiety   . Back pain   . Cerebral aneurysm   . Chiari malformation   . Depression   . Fibromyalgia   . GERD (gastroesophageal reflux disease)   . Hypertension   . Interstitial cystitis   . Kidney stones   . Lupus   . Migraine   . Osteoarthritis   . Raynaud disease   . Seasonal allergies   . Sjogren's disease [facility])     Patient Active Problem List   Diagnosis Date Noted  . Cellulitis of arm, left 04/18/2016  . Abscess 04/18/2016  . Unruptured cerebral aneurysm 12/19/2015  . Migraine with aura and without status migrainosus, not intractable 12/19/2015    Past Surgical History:  Procedure Laterality Date  . APPENDECTOMY    . BRAIN SURGERY  02/2014   pipeline stent  . CHOLECYSTECTOMY    . TONSILLECTOMY    . TUBAL LIGATION      OB History    No data available       Home Medications    Prior to Admission medications   Medication Sig Start Date End Date Taking? Authorizing Provider  diphenhydrAMINE (BENADRYL) 25 mg capsule Take 25 mg by  mouth at bedtime as needed for allergies.   Yes Historical Provider, MD  ipratropium (ATROVENT) 0.06 % nasal spray Place 2 sprays into both nostrils 4 (four) times daily. Patient taking differently: Place 2 sprays into both nostrils 2 (two) times daily.  11/25/15  Yes Linna Hoff, MD  lisinopril (PRINIVIL,ZESTRIL) 10 MG tablet Take 1 tablet (10 mg total) by mouth daily. 11/28/15  Yes Dillard Cannon Young, PA-C  montelukast (SINGULAIR) 10 MG tablet Take 10 mg by mouth daily.    Yes Historical Provider, MD  omeprazole (PRILOSEC) 20 MG capsule Take 20 mg by mouth daily. 12/05/15  Yes Historical Provider, MD  propranolol ER (INDERAL LA) 80 MG 24 hr capsule Take 1 capsule (80 mg total) by mouth daily. 01/21/16  Yes Suanne Marker, MD  gabapentin (NEURONTIN) 100 MG capsule Take 100 mg by mouth 3 (three) times daily.    Historical Provider, MD    Family History Family History  Problem Relation Age of Onset  . Hypertension Mother   . Stroke Mother   . Migraines Mother   . Hypertension Father   . Migraines Father     Social History Social History  Substance Use Topics  . Smoking status: Current Every Day Smoker    Packs/day: 0.25    Types: Cigarettes  . Smokeless tobacco: Never Used  Comment: 01/21/16 one pack every 2.5 days  . Alcohol use Yes     Comment: Socially - maybe once monthly     Allergies   Methotrexate derivatives; Celecoxib; Morphine and related; Other; Penicillins; Sulfa antibiotics; and Tape   Review of Systems Review of Systems  All other systems negative except as documented in the HPI. All pertinent positives and negatives as reviewed in the HPI.   Physical Exam Updated Vital Signs BP (!) 151/73 (BP Location: Left Arm)   Pulse 63   Temp 99.3 F (37.4 C) (Oral)   Resp 20   Ht 5\' 2"  (1.575 m)   Wt 70.3 kg   LMP 04/12/2016 (Exact Date)   SpO2 100%   BMI 28.35 kg/m   Physical Exam  Constitutional: She is oriented to person, place, and time. She appears  well-developed and well-nourished. No distress.  HENT:  Head: Normocephalic and atraumatic.  Mouth/Throat: Oropharynx is clear and moist.  Eyes: Pupils are equal, round, and reactive to light.  Neck: Normal range of motion. Neck supple.  Cardiovascular: Normal rate, regular rhythm and normal heart sounds.  Exam reveals no gallop and no friction rub.   No murmur heard. Pulmonary/Chest: Effort normal and breath sounds normal. No respiratory distress. She has no wheezes.  Abdominal: Soft. Bowel sounds are normal. She exhibits no distension. There is no tenderness.  Musculoskeletal:       Arms: Neurological: She is alert and oriented to person, place, and time. She exhibits normal muscle tone. Coordination normal.  Skin: Skin is warm and dry. No rash noted. No erythema.  Psychiatric: She has a normal mood and affect. Her behavior is normal.  Nursing note and vitals reviewed.    ED Treatments / Results  Labs (all labs ordered are listed, but only abnormal results are displayed) Labs Reviewed  BASIC METABOLIC PANEL - Abnormal; Notable for the following:       Result Value   CO2 20 (*)    BUN 22 (*)    Creatinine, Ser 1.53 (*)    GFR calc non Af Amer 42 (*)    GFR calc Af Amer 48 (*)    All other components within normal limits  CBC WITH DIFFERENTIAL/PLATELET - Abnormal; Notable for the following:    WBC 17.0 (*)    RBC 3.47 (*)    Hemoglobin 11.2 (*)    HCT 34.2 (*)    Neutro Abs 13.4 (*)    Monocytes Absolute 1.6 (*)    All other components within normal limits  COMPREHENSIVE METABOLIC PANEL - Abnormal; Notable for the following:    CO2 20 (*)    Calcium 8.2 (*)    Total Protein 6.0 (*)    Albumin 3.0 (*)    AST 13 (*)    ALT 10 (*)    All other components within normal limits  CBC - Abnormal; Notable for the following:    WBC 10.8 (*)    RBC 3.09 (*)    Hemoglobin 10.2 (*)    HCT 31.5 (*)    MCV 101.9 (*)    All other components within normal limits  BASIC METABOLIC  PANEL - Abnormal; Notable for the following:    Calcium 8.5 (*)    All other components within normal limits  CBC - Abnormal; Notable for the following:    RBC 3.22 (*)    Hemoglobin 10.6 (*)    HCT 31.6 (*)    All other components within normal limits  CULTURE, BLOOD (ROUTINE X 2)  CULTURE, BLOOD (ROUTINE X 2)  MRSA PCR SCREENING  BASIC METABOLIC PANEL  CBC  I-STAT CG4 LACTIC ACID, ED    EKG  EKG Interpretation None       Radiology Ct Humerus Left W Contrast  Result Date: 04/19/2016 CLINICAL DATA:  Hypertension and shortness disease with left axillary pain. Workup significant for left axillary abscess status post incision and drainage in the ED. Concern for abscess in the arm. EXAM: CT OF THE UPPER LEFT EXTREMITY WITH CONTRAST TECHNIQUE: Multidetector CT imaging of the upper left extremity was performed according to the standard protocol following intravenous contrast administration. COMPARISON:  None. CONTRAST:  ISOVUE-300 IOPAMIDOL (ISOVUE-300) INJECTION 61%<Contrast>171mL ISOVUE-300 IOPAMIDOL (ISOVUE-300) INJECTION 61% FINDINGS: Bones/Joint/Cartilage Nonacute.  No bone destruction. Ligaments Suboptimally assessed by CT. Muscles and Tendons There soft tissue induration of the subcutaneous fat from left axilla and coursing over the biceps muscle. Mild reactive edematous change of the biceps muscle without drainable fluid collection or abnormal enhancement identified. Soft tissues Cellulitis of the left axilla tracking along the ventral and medial aspect of the left arm without soft tissue abscess identified. Small reactive lymph nodes in the left axilla measuring up to 6 mm short axis. IMPRESSION: No drainable fluid collections of the left arm. Cellulitis from left axilla to elbow joint with probable mild myositis of the biceps but no pyomyositis. No acute fracture nor bone destruction.  The Electronically Signed   By: Tollie Eth M.D.   On: 04/19/2016 20:43     Procedures Procedures (including critical care time)  Medications Ordered in ED Medications  diphenhydrAMINE (BENADRYL) capsule 25 mg (25 mg Oral Given 04/20/16 2142)  propranolol ER (INDERAL LA) 24 hr capsule 80 mg (80 mg Oral Given 04/20/16 1047)  gabapentin (NEURONTIN) capsule 100 mg (100 mg Oral Given 04/20/16 2142)  montelukast (SINGULAIR) tablet 10 mg (10 mg Oral Given 04/20/16 1046)  pantoprazole (PROTONIX) EC tablet 40 mg (40 mg Oral Given 04/20/16 1046)  ipratropium (ATROVENT) 0.06 % nasal spray 2 spray (2 sprays Each Nare Given 04/20/16 2145)  enoxaparin (LOVENOX) injection 40 mg (40 mg Subcutaneous Given 04/20/16 1047)  sodium chloride flush (NS) 0.9 % injection 3 mL (3 mLs Intravenous Given 04/20/16 2144)  0.9 %  sodium chloride infusion ( Intravenous New Bag/Given 04/20/16 2143)  acetaminophen (TYLENOL) tablet 650 mg (650 mg Oral Given 04/20/16 0058)    Or  acetaminophen (TYLENOL) suppository 650 mg ( Rectal See Alternative 04/20/16 0058)  ondansetron (ZOFRAN) tablet 4 mg (not administered)    Or  ondansetron (ZOFRAN) injection 4 mg (not administered)  traMADol (ULTRAM) tablet 50 mg (50 mg Oral Given 04/19/16 1425)  Influenza vac split quadrivalent PF (FLUARIX) injection 0.5 mL (not administered)  HYDROmorphone (DILAUDID) injection 1 mg (1 mg Intravenous Given 04/20/16 2335)  vancomycin (VANCOCIN) IVPB 1000 mg/200 mL premix (1,000 mg Intravenous Given 04/20/16 1722)  aztreonam (AZACTAM) 1 GM IVPB (1 g Intravenous Given 04/20/16 2142)  iopamidol (ISOVUE-300) 61 % injection (not administered)  docusate sodium (COLACE) capsule 100 mg (100 mg Oral Given 04/20/16 1046)  lisinopril (PRINIVIL,ZESTRIL) tablet 10 mg (10 mg Oral Given 04/20/16 1417)  sodium chloride 0.9 % bolus 1,000 mL (0 mLs Intravenous Stopped 04/18/16 0914)  clindamycin (CLEOCIN) IVPB 900 mg (0 mg Intravenous Stopped 04/18/16 0821)  HYDROmorphone (DILAUDID) injection 1 mg (1 mg Intravenous Given 04/18/16 0752)  ondansetron  (ZOFRAN) injection 4 mg (4 mg Intravenous Given 04/18/16 0752)  lidocaine (XYLOCAINE) 2 % (with pres)  injection 400 mg (400 mg Infiltration Given 04/18/16 1000)  sodium chloride 0.9 % bolus 1,000 mL (1,000 mLs Intravenous New Bag/Given 04/18/16 1232)  vancomycin (VANCOCIN) IVPB 1000 mg/200 mL premix (1,000 mg Intravenous New Bag/Given 04/18/16 1233)  HYDROmorphone (DILAUDID) injection 1 mg (1 mg Intravenous Given 04/18/16 1130)  HYDROmorphone (DILAUDID) injection 1 mg (1 mg Intravenous Given 04/18/16 1339)  HYDROmorphone (DILAUDID) injection 1 mg (0 mg Intravenous Duplicate 04/19/16 1042)  iopamidol (ISOVUE-300) 61 % injection 100 mL (100 mLs Intravenous Contrast Given 04/19/16 2009)     Initial Impression / Assessment and Plan / ED Course  I have reviewed the triage vital signs and the nursing notes.  Pertinent labs & imaging results that were available during my care of the patient were reviewed by me and considered in my medical decision making (see chart for details).     INCISION AND DRAINAGE Performed by: Carlyle DollyLAWYER,Derry Arbogast W Consent: Verbal consent obtained. Risks and benefits: risks, benefits and alternatives were discussed Type: abscess  Body area: Left axilla  Anesthesia: local infiltration  Incision was made with a scalpel.  Local anesthetic: lidocaine 2 % without epinephrine  Anesthetic total: 8 ml  Complexity: complex Blunt dissection to break up loculations  Drainage: purulent  Drainage amount: Large   Packing material: 1/4 in iodoform gauze  Patient tolerance: Patient tolerated the procedure well with no immediate complications.    Patient was given IV antibiotics incision draining was performed.  I called the hospitalist due to the fact that she does have some systemic signs of this infection along with the large amount of surrounding cellulitis Final Clinical Impressions(s) / ED Diagnoses   Final diagnoses:  Abscess of left axilla  Cellulitis of left upper  extremity    New Prescriptions Current Discharge Medication List       Charlestine NightChristopher Albina Gosney, PA-C 04/21/16 0130    Cathren LaineKevin Steinl, MD 04/26/16 [phone]

## 2016-04-21 NOTE — Progress Notes (Signed)
Pharmacy Antibiotic Note  Joy Nelson is a 41 y.o. female with left under arm abscess that spontaneously popped a few weeks ago, presented to the ED on 04/18/16 with c/o of worsening pain from abscess area.  To start vancomycin for abscess and suspected sepsis on 1/21. To also add aztreonam per pharmacy per Md orders on 1/22. Note patient with hazy PCN allergy and cannot find in records admin of any cephalosporins either.   Plan:   Continue Aztreonam 1g IV q8h  Continue Vancomycin 1g IV q12h.  ________________________________  Height: 5\' 2"  (157.5 cm) Weight: 155 lb (70.3 kg) IBW/kg (Calculated) : 50.1  Temp (24hrs), Avg:99 F (37.2 C), Min:98.7 F (37.1 C), Max:99.3 F (37.4 C)   Recent Labs Lab 04/18/16 0756 04/18/16 1050 04/19/16 0541 04/20/16 0542 04/21/16 0537  WBC 17.0*  --  10.8* 7.7 6.0  CREATININE 1.53*  --  0.68 0.61 0.72  LATICACIDVEN  --  0.56  --   --   --     Estimated Creatinine Clearance: 85.9 mL/min (by C-G formula based on SCr of 0.72 mg/dL).    Allergies  Allergen Reactions  . Methotrexate Derivatives Other (See Comments)    Flushing   . Celecoxib Swelling  . Morphine And Related Itching  . Other     Imitrex  . Penicillins Other (See Comments)    Childhood reaction.  Turns blue and has convulsions.  . Sulfa Antibiotics Swelling  . Tape Rash    Can have paper tape.    Antimicrobials this admission:  1/21 clinda x1 1/21 vanc >>  1/22 aztreonam >>  Dose adjustments this admission:  1/22 Vanc changed from 500mg  q12 to 1g q12 for improved SCr (used goal trough 10-15) 1/24: Vanc trough at 1700 = 11 on 1g q12  (goal 10-15)  Microbiology results:  1/22 BCx: ngtd 1/21 MRSA PCR: negative   Thank you for allowing pharmacy to be a part of this patient's care.  Lynann Beaverhristine Valetta Mulroy PharmD, BCPS Pager [phone] 04/21/2016 4:41 PM

## 2016-04-21 NOTE — Progress Notes (Signed)
Pharmacy Antibiotic Note  Joy Nelson is a 41 y.o. female with left under arm abscess that spontaneously popped a few weeks ago, presented to the ED on 04/18/16 with c/o of worsening pain from abscess area.  To start vancomycin for abscess and suspected sepsis on 1/21. To also add aztreonam per pharmacy per Md orders on 1/22. Note patient with hazy PCN allergy and cannot find in records admin of any cephalosporins either   Plan: Day 4 Antibiotics 1) Continue Vancomycin 1g q12 2) Continue Aztreonam 1g q8 3) Check vanc trough tonight prior to 6pm dose.  ________________________________  Height: 5\' 2"  (157.5 cm) Weight: 155 lb (70.3 kg) IBW/kg (Calculated) : 50.1  Temp (24hrs), Avg:98.9 F (37.2 C), Min:98.5 F (36.9 C), Max:99.3 F (37.4 C)   Recent Labs Lab 04/18/16 0756 04/18/16 1050 04/19/16 0541 04/20/16 0542 04/21/16 0537  WBC 17.0*  --  10.8* 7.7 6.0  CREATININE 1.53*  --  0.68 0.61 0.72  LATICACIDVEN  --  0.56  --   --   --     Estimated Creatinine Clearance: 85.9 mL/min (by C-G formula based on SCr of 0.72 mg/dL).    Allergies  Allergen Reactions  . Methotrexate Derivatives Other (See Comments)    Flushing   . Celecoxib Swelling  . Morphine And Related Itching  . Other     Imitrex  . Penicillins Other (See Comments)    Childhood reaction.  Turns blue and has convulsions.  . Sulfa Antibiotics Swelling  . Tape Rash    Can have paper tape.    Antimicrobials this admission:  1/21 clinda x1 1/21 vanc >>  1/22 aztreonam >>  Dose adjustments this admission:  1/22 Vanc changed from 500mg  q12 to 1g q12 for improved SCr (used goal trough 10-15) 1/24: Vanc trough at 1700 = ___ on 1g q12  Microbiology results:  1/22 BCx: ngtd 1/21 MRSA PCR: negative   Thank you for allowing pharmacy to be a part of this patient's care.   Hessie KnowsJustin M Kai Calico, PharmD, BCPS Pager [phone] 04/21/2016 10:32 AM

## 2016-04-22 ENCOUNTER — Inpatient Hospital Stay (HOSPITAL_COMMUNITY): Payer: Medicaid Other | Admitting: Registered Nurse

## 2016-04-22 ENCOUNTER — Ambulatory Visit: Payer: Self-pay | Admitting: Surgery

## 2016-04-22 ENCOUNTER — Encounter (HOSPITAL_COMMUNITY): Payer: Self-pay | Admitting: Surgery

## 2016-04-22 ENCOUNTER — Encounter (HOSPITAL_COMMUNITY)
Admission: EM | Disposition: A | Discharge status: Home Health | Payer: Self-pay | Source: Home / Self Care | Attending: Internal Medicine

## 2016-04-22 HISTORY — PX: I & D EXTREMITY: SHX5045

## 2016-04-22 LAB — BASIC METABOLIC PANEL
Anion gap: 8 (ref 5–15)
BUN: 12 mg/dL (ref 6–20)
CHLORIDE: 102 mmol/L (ref 101–111)
CO2: 28 mmol/L (ref 22–32)
CREATININE: 0.61 mg/dL (ref 0.44–1.00)
Calcium: 9 mg/dL (ref 8.9–10.3)
GFR calc Af Amer: >60 mL/min (ref >60)
GFR calc non Af Amer: >60 mL/min (ref >60)
GLUCOSE: 94 mg/dL (ref 65–99)
POTASSIUM: 4 mmol/L (ref 3.5–5.1)
SODIUM: 138 mmol/L (ref 135–145)

## 2016-04-22 LAB — CBC
HCT: 35.8 % — ABNORMAL LOW (ref 36.0–46.0)
Hemoglobin: 11.7 g/dL — ABNORMAL LOW (ref 12.0–15.0)
MCH: 32.2 pg (ref 26.0–34.0)
MCHC: 32.7 g/dL (ref 30.0–36.0)
MCV: 98.6 fL (ref 78.0–100.0)
PLATELETS: 368 10*3/uL (ref 150–400)
RBC: 3.63 MIL/uL — ABNORMAL LOW (ref 3.87–5.11)
RDW: 13 % (ref 11.5–15.5)
WBC: 5.7 10*3/uL (ref 4.0–10.5)

## 2016-04-22 LAB — CREATININE, SERUM
CREATININE: 0.56 mg/dL (ref 0.44–1.00)
GFR calc non Af Amer: >60 mL/min (ref >60)

## 2016-04-22 SURGERY — IRRIGATION AND DEBRIDEMENT EXTREMITY
Anesthesia: General | Site: Arm Upper | Laterality: Left

## 2016-04-22 MED ORDER — 0.9 % SODIUM CHLORIDE (POUR BTL) OPTIME
TOPICAL | Status: DC | PRN
  Administered 2016-04-22: 1000 mL

## 2016-04-22 MED ORDER — HYDROMORPHONE HCL 1 MG/ML IJ SOLN
0.2500 mg | INTRAMUSCULAR | Status: DC | PRN
  Administered 2016-04-22: 0.5 mg via INTRAVENOUS

## 2016-04-22 MED ORDER — LIDOCAINE 2% (20 MG/ML) 5 ML SYRINGE
INTRAMUSCULAR | Status: AC
  Filled 2016-04-22: qty 5

## 2016-04-22 MED ORDER — MIDAZOLAM HCL 5 MG/5ML IJ SOLN
INTRAMUSCULAR | Status: DC | PRN
  Administered 2016-04-22: 2 mg via INTRAVENOUS

## 2016-04-22 MED ORDER — EPHEDRINE SULFATE-NACL 50-0.9 MG/10ML-% IV SOSY
PREFILLED_SYRINGE | INTRAVENOUS | Status: DC | PRN
  Administered 2016-04-22: 5 mg via INTRAVENOUS

## 2016-04-22 MED ORDER — BUPIVACAINE HCL (PF) 0.25 % IJ SOLN
INTRAMUSCULAR | Status: AC
  Filled 2016-04-22: qty 30

## 2016-04-22 MED ORDER — EPHEDRINE 5 MG/ML INJ
INTRAVENOUS | Status: AC
  Filled 2016-04-22: qty 10

## 2016-04-22 MED ORDER — FENTANYL CITRATE (PF) 100 MCG/2ML IJ SOLN
INTRAMUSCULAR | Status: AC
  Filled 2016-04-22: qty 2

## 2016-04-22 MED ORDER — ONDANSETRON HCL 4 MG/2ML IJ SOLN
INTRAMUSCULAR | Status: AC
  Filled 2016-04-22: qty 2

## 2016-04-22 MED ORDER — HYDROMORPHONE HCL 1 MG/ML IJ SOLN
INTRAMUSCULAR | Status: AC
  Filled 2016-04-22: qty 1

## 2016-04-22 MED ORDER — BUPIVACAINE HCL (PF) 0.25 % IJ SOLN
INTRAMUSCULAR | Status: DC | PRN
  Administered 2016-04-22: 10 mL

## 2016-04-22 MED ORDER — PROPOFOL 10 MG/ML IV BOLUS
INTRAVENOUS | Status: AC
  Filled 2016-04-22: qty 20

## 2016-04-22 MED ORDER — MIDAZOLAM HCL 2 MG/2ML IJ SOLN
INTRAMUSCULAR | Status: AC
  Filled 2016-04-22: qty 2

## 2016-04-22 MED ORDER — PROPOFOL 10 MG/ML IV BOLUS
INTRAVENOUS | Status: DC | PRN
  Administered 2016-04-22: 40 mg via INTRAVENOUS
  Administered 2016-04-22: 160 mg via INTRAVENOUS

## 2016-04-22 MED ORDER — HEPARIN SODIUM (PORCINE) 5000 UNIT/ML IJ SOLN
5000.0000 [IU] | Freq: Three times a day (TID) | INTRAMUSCULAR | Status: DC
  Administered 2016-04-22 – 2016-04-24 (×6): 5000 [IU] via SUBCUTANEOUS
  Filled 2016-04-22 (×6): qty 1

## 2016-04-22 MED ORDER — FENTANYL CITRATE (PF) 100 MCG/2ML IJ SOLN
INTRAMUSCULAR | Status: DC | PRN
  Administered 2016-04-22: 50 ug via INTRAVENOUS
  Administered 2016-04-22 (×2): 25 ug via INTRAVENOUS

## 2016-04-22 MED ORDER — LIDOCAINE HCL 1 % IJ SOLN
INTRAMUSCULAR | Status: AC
  Filled 2016-04-22: qty 20

## 2016-04-22 MED ORDER — PROMETHAZINE HCL 25 MG/ML IJ SOLN: 6.2500 mg | INTRAMUSCULAR | Status: DC | PRN

## 2016-04-22 MED ORDER — LIDOCAINE 2% (20 MG/ML) 5 ML SYRINGE
INTRAMUSCULAR | Status: DC | PRN
  Administered 2016-04-22: 100 mg via INTRAVENOUS

## 2016-04-22 MED ORDER — LACTATED RINGERS IV SOLN
INTRAVENOUS | Status: DC | PRN
  Administered 2016-04-22: 12:00:00 via INTRAVENOUS

## 2016-04-22 MED ORDER — HYDROMORPHONE HCL 1 MG/ML IJ SOLN
0.2500 mg | INTRAMUSCULAR | Status: DC | PRN
  Administered 2016-04-22 (×5): 0.5 mg via INTRAVENOUS

## 2016-04-22 MED ORDER — ONDANSETRON HCL 4 MG/2ML IJ SOLN
INTRAMUSCULAR | Status: DC | PRN
  Administered 2016-04-22: 4 mg via INTRAVENOUS

## 2016-04-22 MED ORDER — LIDOCAINE-EPINEPHRINE (PF) 1 %-1:200000 IJ SOLN
INTRAMUSCULAR | Status: AC
  Filled 2016-04-22: qty 30

## 2016-04-22 MED ORDER — MEPERIDINE HCL 50 MG/ML IJ SOLN: 6.2500 mg | INTRAMUSCULAR | Status: DC | PRN

## 2016-04-22 SURGICAL SUPPLY — 32 items
BNDG GAUZE ELAST 4 BULKY (GAUZE/BANDAGES/DRESSINGS) IMPLANT
COVER SURGICAL LIGHT HANDLE (MISCELLANEOUS) ×3 IMPLANT
DECANTER SPIKE VIAL GLASS SM (MISCELLANEOUS) IMPLANT
DRAIN PENROSE 18X1/2 LTX STRL (DRAIN) IMPLANT
DRAPE LAPAROTOMY TRNSV 102X78 (DRAPE) ×3 IMPLANT
DRAPE LG THREE QUARTER DISP (DRAPES) ×3 IMPLANT
DRSG PAD ABDOMINAL 8X10 ST (GAUZE/BANDAGES/DRESSINGS) ×6 IMPLANT
ELECT REM PT RETURN 9FT ADLT (ELECTROSURGICAL) ×3
ELECTRODE REM PT RTRN 9FT ADLT (ELECTROSURGICAL) ×1 IMPLANT
GAUZE PACKING IODOFORM 1/4X15 (GAUZE/BANDAGES/DRESSINGS) ×3 IMPLANT
GAUZE SPONGE 4X4 12PLY STRL (GAUZE/BANDAGES/DRESSINGS) ×3 IMPLANT
GLOVE BIO SURGEON STRL SZ 6.5 (GLOVE) ×2 IMPLANT
GLOVE BIO SURGEONS STRL SZ 6.5 (GLOVE) ×1
GLOVE BIOGEL M 8.0 STRL (GLOVE) ×3 IMPLANT
GLOVE INDICATOR 6.5 STRL GRN (GLOVE) ×3 IMPLANT
GOWN STRL REUS W/TWL LRG LVL3 (GOWN DISPOSABLE) ×3 IMPLANT
GOWN STRL REUS W/TWL XL LVL3 (GOWN DISPOSABLE) ×3 IMPLANT
KIT BASIN OR (CUSTOM PROCEDURE TRAY) ×3 IMPLANT
NEEDLE HYPO 22GX1.5 SAFETY (NEEDLE) IMPLANT
PACK GENERAL/GYN (CUSTOM PROCEDURE TRAY) ×3 IMPLANT
PAD ABD 8X10 STRL (GAUZE/BANDAGES/DRESSINGS) ×3 IMPLANT
SUT ETHILON 3 0 PS 1 (SUTURE) IMPLANT
SUT VIC AB 3-0 SH 27 (SUTURE)
SUT VIC AB 3-0 SH 27XBRD (SUTURE) IMPLANT
SWAB COLLECTION DEVICE MRSA (MISCELLANEOUS) ×3 IMPLANT
SWAB CULTURE ESWAB REG 1ML (MISCELLANEOUS) ×3 IMPLANT
SYR BULB IRRIGATION 50ML (SYRINGE) ×3 IMPLANT
SYR CONTROL 10ML LL (SYRINGE) IMPLANT
TAPE CLOTH SURG 4X10 WHT LF (GAUZE/BANDAGES/DRESSINGS) ×3 IMPLANT
TOWEL OR 17X26 10 PK STRL BLUE (TOWEL DISPOSABLE) ×3 IMPLANT
TOWEL OR NON WOVEN STRL DISP B (DISPOSABLE) ×3 IMPLANT
YANKAUER SUCT BULB TIP 10FT TU (MISCELLANEOUS) ×3 IMPLANT

## 2016-04-22 NOTE — Anesthesia Postprocedure Evaluation (Signed)
Anesthesia Post Note  Patient: Joy Nelson  Procedure(s) Performed: Procedure(s) (LRB): IRRIGATION AND DEBRIDEMENT LEFT UPPER ARM POSSIBLE ABCESS (Left)  Patient location during evaluation: PACU Anesthesia Type: General Level of consciousness: sedated and patient cooperative Pain management: pain level controlled Vital Signs Assessment: post-procedure vital signs reviewed and stable Respiratory status: spontaneous breathing Cardiovascular status: stable Anesthetic complications: no       Last Vitals:  Vitals:   04/22/16 1355 04/22/16 1410  BP: 131/74 (!) 127/58  Pulse: (!) 54 69  Resp: 12 12  Temp: 36.9 C 37.2 C    Last Pain:  Vitals:   04/22/16 1718  TempSrc:   PainSc: 8                  Lewie LoronJohn Torianne Laflam

## 2016-04-22 NOTE — Progress Notes (Signed)
Patient ID: Joy Nelson, female   DOB: [birth year], 41 y.o.   MRN: [id number] [facility] Surgery Progress Note:   * No surgery found *  Subjective: Mental status is alert and clear Objective: Vital signs in last 24 hours: Temp:  [98 F (36.7 C)-98.7 F (37.1 C)] 98 F (36.7 C) (01/25 0617) Pulse Rate:  [51-80] 57 (01/25 0617) Resp:  [18] 18 (01/25 0617) BP: (147-156)/(67-79) 156/67 (01/24 2106) SpO2:  [98 %-100 %] 100 % (01/25 0617)  Intake/Output from previous day: 01/24 0701 - 01/25 0700 In: 1245 [P.O.:240; I.V.:905; IV Piggyback:100] Out: 475 [Urine:475] Intake/Output this shift: No intake/output data recorded.  Physical Exam: Work of breathing is normal.  There  Is a firm and tender area in the left axilla/arm that is consistent with more infection.    Lab Results:  Results for orders placed or performed during the hospital encounter of 04/18/16 (from the past 48 hour(s))  Basic metabolic panel     Status: Abnormal   Collection Time: 04/21/16  5:37 AM  Result Value Ref Range   Sodium 137 135 - 145 mmol/L   Potassium 4.1 3.5 - 5.1 mmol/L   Chloride 100 (L) 101 - 111 mmol/L   CO2 29 22 - 32 mmol/L   Glucose, Bld 93 65 - 99 mg/dL   BUN 10 6 - 20 mg/dL   Creatinine, Ser 0.72 0.44 - 1.00 mg/dL   Calcium 9.3 8.9 - 10.3 mg/dL   GFR calc non Af Amer >60 >60 mL/min   GFR calc Af Amer >60 >60 mL/min    Comment: (NOTE) The eGFR has been calculated using the CKD EPI equation. This calculation has not been validated in all clinical situations. eGFR's persistently <60 mL/min signify possible Chronic Kidney Disease.    Anion gap 8 5 - 15  CBC     Status: Abnormal   Collection Time: 04/21/16  5:37 AM  Result Value Ref Range   WBC 6.0 4.0 - 10.5 K/uL   RBC 3.46 (L) 3.87 - 5.11 MIL/uL   Hemoglobin 11.1 (L) 12.0 - 15.0 g/dL   HCT 34.3 (L) 36.0 - 46.0 %   MCV 99.1 78.0 - 100.0 fL   MCH 32.1 26.0 - 34.0 pg   MCHC 32.4 30.0 - 36.0 g/dL   RDW 13.0 11.5 - 15.5 %   Platelets  327 150 - 400 K/uL  Vancomycin, trough     Status: Abnormal   Collection Time: 04/21/16  5:02 PM  Result Value Ref Range   Vancomycin Tr 11 (L) 15 - 20 ug/mL  Basic metabolic panel     Status: None   Collection Time: 04/22/16  6:02 AM  Result Value Ref Range   Sodium 138 135 - 145 mmol/L   Potassium 4.0 3.5 - 5.1 mmol/L   Chloride 102 101 - 111 mmol/L   CO2 28 22 - 32 mmol/L   Glucose, Bld 94 65 - 99 mg/dL   BUN 12 6 - 20 mg/dL   Creatinine, Ser 0.61 0.44 - 1.00 mg/dL   Calcium 9.0 8.9 - 10.3 mg/dL   GFR calc non Af Amer >60 >60 mL/min   GFR calc Af Amer >60 >60 mL/min    Comment: (NOTE) The eGFR has been calculated using the CKD EPI equation. This calculation has not been validated in all clinical situations. eGFR's persistently <60 mL/min signify possible Chronic Kidney Disease.    Anion gap 8 5 - 15    Radiology/Results: No results found.  Anti-infectives: Anti-infectives    Start     Dose/Rate Route Frequency Ordered Stop   04/19/16 1800  vancomycin (VANCOCIN) IVPB 1000 mg/200 mL premix     1,000 mg 200 mL/hr over 60 Minutes Intravenous Every 12 hours 04/19/16 1048     04/19/16 1400  aztreonam (AZACTAM) 1 g in dextrose 5 % 50 mL IVPB  Status:  Discontinued     1 g 100 mL/hr over 30 Minutes Intravenous Every 8 hours 04/19/16 1231 04/19/16 1318   04/19/16 1400  aztreonam (AZACTAM) 1 GM IVPB     1 g 100 mL/hr over 30 Minutes Intravenous Every 8 hours 04/19/16 1318     04/18/16 2200  vancomycin (VANCOCIN) 500 mg in sodium chloride 0.9 % 100 mL IVPB  Status:  Discontinued     500 mg 100 mL/hr over 60 Minutes Intravenous Every 12 hours 04/18/16 0946 04/19/16 1048   04/18/16 1300  vancomycin (VANCOCIN) IVPB 1000 mg/200 mL premix  Status:  Discontinued     1,000 mg 200 mL/hr over 60 Minutes Intravenous  Once 04/18/16 1253 04/18/16 1302   04/18/16 0930  vancomycin (VANCOCIN) IVPB 1000 mg/200 mL premix     1,000 mg 200 mL/hr over 60 Minutes Intravenous  Once 04/18/16 0920  04/18/16 1333   04/18/16 0645  clindamycin (CLEOCIN) IVPB 900 mg     900 mg 100 mL/hr over 30 Minutes Intravenous  Once 04/18/16 8288 04/18/16 3374      Assessment/Plan: Problem List: Patient Active Problem List   Diagnosis Date Noted  . Cellulitis of arm, left 04/18/2016  . Abscess 04/18/2016  . Unruptured cerebral aneurysm 12/19/2015  . Migraine with aura and without status migrainosus, not intractable 12/19/2015    Plan to I & D and get culture.  Patient aware and ready to proceed to OR this AM * No surgery found *    LOS: 4 days   Matt B. Hassell Done, MD, [facility] [phone] beeper [phone]  04/22/2016 8:19 AM

## 2016-04-22 NOTE — Progress Notes (Signed)
PROGRESS NOTE    Joy Nelson   ZOX:[id number]  DOB: 08-18-1975  DOA: 04/18/2016 PCP: Daune PerchBLUNT-EVANS, LACHELLE A, NP   Brief Narrative:  41 y.o.femalewith medical history significant of hypertension and sjogren's disease who presents with left axillary pain, workup significant for left axillary abscess, status post I&D in ED.   Subjective: Pain in left axilla  Assessment & Plan: Sepsis, due to abscesses and cellulitis. -   fever and hypotension with leukocytosis. - S/P I&D in ED, unfortunately no wound cultures sent, continue with IV vancomycin, and IV aztreonam (penicillin allergy) - now has new swelling in axilla which is quite tender- has been taken to OR today for drainage - blood cultures- NGTD - Surgery consult appreciated   Acute kidney injury.  -Probably prerenal, associated with cellulitis and sepsis syndrome, resolved with IV fluid  Hypertension.  - Will continue propanolol & lisinopril   GERD.  - Continue omeprazole.   DVT prophylaxis: Lovenox Code Status: Full code Family Communication:  Disposition Plan: home when stable Consultants:   Gen sugery Procedures:   I and D Antimicrobials:  Anti-infectives    Start     Dose/Rate Route Frequency Ordered Stop   04/19/16 1800  vancomycin (VANCOCIN) IVPB 1000 mg/200 mL premix     1,000 mg 200 mL/hr over 60 Minutes Intravenous Every 12 hours 04/19/16 1048     04/19/16 1400  aztreonam (AZACTAM) 1 g in dextrose 5 % 50 mL IVPB  Status:  Discontinued     1 g 100 mL/hr over 30 Minutes Intravenous Every 8 hours 04/19/16 1231 04/19/16 1318   04/19/16 1400  aztreonam (AZACTAM) 1 GM IVPB     1 g 100 mL/hr over 30 Minutes Intravenous Every 8 hours 04/19/16 1318     04/18/16 2200  vancomycin (VANCOCIN) 500 mg in sodium chloride 0.9 % 100 mL IVPB  Status:  Discontinued     500 mg 100 mL/hr over 60 Minutes Intravenous Every 12 hours 04/18/16 0946 04/19/16 1048   04/18/16 1300  vancomycin (VANCOCIN) IVPB 1000 mg/200  mL premix  Status:  Discontinued     1,000 mg 200 mL/hr over 60 Minutes Intravenous  Once 04/18/16 1253 04/18/16 1302   04/18/16 0930  vancomycin (VANCOCIN) IVPB 1000 mg/200 mL premix     1,000 mg 200 mL/hr over 60 Minutes Intravenous  Once 04/18/16 0920 04/18/16 1333   04/18/16 0645  clindamycin (CLEOCIN) IVPB 900 mg     900 mg 100 mL/hr over 30 Minutes Intravenous  Once 04/18/16 0637 04/18/16 0821       Objective: Vitals:   04/22/16 1350 04/22/16 1354 04/22/16 1355 04/22/16 1410  BP:  131/74 131/74 (!) 127/58  Pulse: 65 62 (!) 54 69  Resp: 12 12 12 12   Temp:   98.5 F (36.9 C) 98.9 F (37.2 C)  TempSrc:      SpO2: 96% 97% 96% 98%  Weight:      Height:        Intake/Output Summary (Last 24 hours) at 04/22/16 1420 Last data filed at 04/22/16 1355  Gross per 24 hour  Intake             2095 ml  Output              480 ml  Net             1615 ml   Filed Weights   04/18/16 0333  Weight: 70.3 kg (155 lb)    Examination: General exam: Appears  comfortable  HEENT: PERRLA, oral mucosa moist, no sclera icterus or thrush Respiratory system: Clear to auscultation. Respiratory effort normal. Cardiovascular system: S1 & S2 heard, RRR.  No murmurs  Gastrointestinal system: Abdomen soft, non-tender, nondistended. Normal bowel sound. No organomegaly Central nervous system: Alert and oriented. No focal neurological deficits. Extremities: No cyanosis, clubbing or edema Skin: cellulitis and abscess which is currently draining- fluctuant tender area on lateral aspect of axilla Psychiatry:  Mood & affect appropriate.     Data Reviewed: I have personally reviewed following labs and imaging studies  CBC:  Recent Labs Lab 04/18/16 0756 04/19/16 0541 04/20/16 0542 04/21/16 0537  WBC 17.0* 10.8* 7.7 6.0  NEUTROABS 13.4*  --   --   --   HGB 11.2* 10.2* 10.6* 11.1*  HCT 34.2* 31.5* 31.6* 34.3*  MCV 98.6 101.9* 98.1 99.1  PLT 269 252 250 327   Basic Metabolic  Panel:  Recent Labs Lab 04/18/16 0756 04/19/16 0541 04/20/16 0542 04/21/16 0537 04/22/16 0602  NA 136 136 137 137 138  K 3.7 4.2 4.7 4.1 4.0  CL 105 110 105 100* 102  CO2 20* 20* 23 29 28   GLUCOSE 93 94 86 93 94  BUN 22* 11 10 10 12   CREATININE 1.53* 0.68 0.61 0.72 0.61  CALCIUM 9.1 8.2* 8.5* 9.3 9.0   GFR: Estimated Creatinine Clearance: 85.9 mL/min (by C-G formula based on SCr of 0.61 mg/dL). Liver Function Tests:  Recent Labs Lab 04/19/16 0541  AST 13*  ALT 10*  ALKPHOS 62  BILITOT 0.3  PROT 6.0*  ALBUMIN 3.0*   No results for input(s): LIPASE, AMYLASE in the last 168 hours. No results for input(s): AMMONIA in the last 168 hours. Coagulation Profile: No results for input(s): INR, PROTIME in the last 168 hours. Cardiac Enzymes: No results for input(s): CKTOTAL, CKMB, CKMBINDEX, TROPONINI in the last 168 hours. BNP (last 3 results) No results for input(s): PROBNP in the last 8760 hours. HbA1C: No results for input(s): HGBA1C in the last 72 hours. CBG: No results for input(s): GLUCAP in the last 168 hours. Lipid Profile: No results for input(s): CHOL, HDL, LDLCALC, TRIG, CHOLHDL, LDLDIRECT in the last 72 hours. Thyroid Function Tests: No results for input(s): TSH, T4TOTAL, FREET4, T3FREE, THYROIDAB in the last 72 hours. Anemia Panel: No results for input(s): VITAMINB12, FOLATE, FERRITIN, TIBC, IRON, RETICCTPCT in the last 72 hours. Urine analysis: No results found for: COLORURINE, APPEARANCEUR, LABSPEC, PHURINE, GLUCOSEU, HGBUR, BILIRUBINUR, KETONESUR, PROTEINUR, UROBILINOGEN, NITRITE, LEUKOCYTESUR Sepsis Labs: @LABRCNTIP (procalcitonin:4,lacticidven:4) ) Recent Results (from the past 240 hour(s))  Blood Culture (routine x 2)     Status: None (Preliminary result)   Collection Time: 04/18/16 11:30 AM  Result Value Ref Range Status   Specimen Description BLOOD RIGHT ARM  Final   Special Requests BOTTLES DRAWN AEROBIC AND ANAEROBIC 5CC  Final   Culture   Final     NO GROWTH 3 DAYS Performed at [facility], [address] [place], [place] [address]    Report Status PENDING  Incomplete  MRSA PCR Screening     Status: None   Collection Time: 04/18/16  1:44 PM  Result Value Ref Range Status   MRSA by PCR NEGATIVE NEGATIVE Final    Comment:        The GeneXpert MRSA Assay (FDA approved for NASAL specimens only), is one component of a comprehensive MRSA colonization surveillance program. It is not intended to diagnose MRSA infection nor to guide or monitor treatment for MRSA  infections.   Blood Culture (routine x 2)     Status: None (Preliminary result)   Collection Time: 04/19/16  5:41 AM  Result Value Ref Range Status   Specimen Description BLOOD LEFT HAND  Final   Special Requests IN PEDIATRIC BOTTLE 2.5 CC  Final   Culture   Final    NO GROWTH 2 DAYS Performed at [facility], [address], [place], [place] [address]    Report Status PENDING  Incomplete         Radiology Studies: No results found.    Scheduled Meds: . aztreonam  1 g Intravenous Q8H  . gabapentin  100 mg Oral TID  . heparin  5,000 Units Subcutaneous Q8H  . HYDROmorphone      . HYDROmorphone      . HYDROmorphone      . Influenza vac split quadrivalent PF  0.5 mL Intramuscular Tomorrow-1000  . ipratropium  2 spray Each Nare BID  . lisinopril  10 mg Oral Daily  . montelukast  10 mg Oral Daily  . pantoprazole  40 mg Oral Daily  . propranolol ER  80 mg Oral Daily  . sodium chloride flush  3 mL Intravenous Q12H  . vancomycin  1,000 mg Intravenous Q12H   Continuous Infusions: . sodium chloride 100 mL/hr at 04/21/16 2027     LOS: 4 days    Time spent in minutes: 35    Oleta Gunnoe, MD [facility] Pager: [email or web address] Password [email or web address] 04/22/2016, 2:20 PM

## 2016-04-22 NOTE — Progress Notes (Signed)
Dr. Martin talked with patient. 

## 2016-04-22 NOTE — Op Note (Signed)
Surgeon: Wenda LowMatt Ernesta Trabert, MD, FACS  Asst:  none  Anes:  General by LMA  Preop Dx: Left axillary abscess Postop Dx: same  Procedure: I & D of left axillary abscess with culture Location Surgery: [facility] Complications: None noted  EBL:   minimal cc  Drains: None but 1/4 inch iodophor  Description of Procedure:  The patient was taken to OR 2 .  After anesthesia was administered and the patient was prepped a timeout was performed.  The fluctuant area was first aspirated and the aspirate placed in culture tubes for aerobes and anaerobes.  A transverse incision was made in the upper arm and the indurated area explored with a hemostat.  Irrigated with saline and both areas (the new incision and the old incision in the axilla were packed with iodophor.  During irrigation it was noted that the two area communicate.  The area was injected with 0.5% lidocaine plain. And dressed.    The patient tolerated the procedure well and was taken to the PACU in stable condition.     Matt B. Daphine DeutscherMartin, MD, [facility], [place] [phone]

## 2016-04-22 NOTE — Interval H&P Note (Signed)
History and Physical Interval Note:  04/22/2016 12:08 PM  Joy JuneAmanda Nelson  has presented today for surgery, with the diagnosis of POSSIBLE ABCESS LEFT UPPER ARM  The various methods of treatment have been discussed with the patient and family. After consideration of risks, benefits and other options for treatment, the patient has consented to  Procedure(s): IRRIGATION AND DEBRIDEMENT LEFT UPPER ARM POSSIBLE ABCESS (Left) as a surgical intervention .  The patient's history has been reviewed, patient examined, no change in status, stable for surgery.  I have reviewed the patient's chart and labs.  Questions were answered to the patient's satisfaction.     Zenya Hickam B

## 2016-04-22 NOTE — Plan of Care (Signed)
Problem: Pain Managment: Goal: General experience of comfort will improve Outcome: Not Progressing Pain still 10/10.  Administering Dilaudid around the clock every 3 hours.  Pain comes down to 8/10 for a short while.  Continue with pain management.    Problem: Skin Integrity: Goal: Risk for impaired skin integrity will decrease Continue.   I&D today.    Problem: Activity: Goal: Risk for activity intolerance will decrease Outcome: Progressing Has progressed from using BSC to using bathroom facilities.

## 2016-04-22 NOTE — Transfer of Care (Signed)
Immediate Anesthesia Transfer of Care Note  Patient: Joy Nelson  Procedure(s) Performed: Procedure(s) with comments: IRRIGATION AND DEBRIDEMENT LEFT UPPER ARM POSSIBLE ABCESS (Left) - Wound left open  Patient Location: PACU  Anesthesia Type:General  Level of Consciousness: awake, alert , oriented and patient cooperative  Airway & Oxygen Therapy: Patient Spontanous Breathing and Patient connected to face mask oxygen  Post-op Assessment: Report given to RN, Post -op Vital signs reviewed and stable and Patient moving all extremities  Post vital signs: Reviewed and stable  Last Vitals:  Vitals:   04/21/16 2106 04/22/16 0617  BP: (!) 156/67   Pulse: 80 (!) 57  Resp: 18 18  Temp: 36.8 C 36.7 C    Last Pain:  Vitals:   04/22/16 1032  TempSrc:   PainSc: 9       Patients Stated Pain Goal: 5 (04/22/16 0656)  Complications: No apparent anesthesia complications

## 2016-04-22 NOTE — Anesthesia Preprocedure Evaluation (Signed)
Anesthesia Evaluation  Patient identified by MRN, date of birth, ID band Patient awake    Reviewed: Allergy & Precautions, NPO status , Patient's Chart, lab work & pertinent test results  Airway Mallampati: II  TM Distance: >3 FB Neck ROM: Full    Dental no notable dental hx.    Pulmonary neg pulmonary ROS, Current Smoker,    Pulmonary exam normal breath sounds clear to auscultation       Cardiovascular hypertension, Pt. on medications + Peripheral Vascular Disease  Normal cardiovascular exam Rhythm:Regular Rate:Normal     Neuro/Psych negative neurological ROS  negative psych ROS   GI/Hepatic Neg liver ROS, GERD  ,  Endo/Other  negative endocrine ROS  Renal/GU Renal disease     Musculoskeletal  (+) Arthritis , Fibromyalgia -  Abdominal   Peds  Hematology negative hematology ROS (+)   Anesthesia Other Findings   Reproductive/Obstetrics negative OB ROS                             Anesthesia Physical Anesthesia Plan  ASA: II  Anesthesia Plan: General   Post-op Pain Management:    Induction: Intravenous  Airway Management Planned: LMA  Additional Equipment:   Intra-op Plan:   Post-operative Plan: Extubation in OR  Informed Consent: I have reviewed the patients History and Physical, chart, labs and discussed the procedure including the risks, benefits and alternatives for the proposed anesthesia with the patient or authorized representative who has indicated his/her understanding and acceptance.   Dental advisory given  Plan Discussed with: CRNA  Anesthesia Plan Comments:         Anesthesia Quick Evaluation

## 2016-04-22 NOTE — H&P (View-Only) (Signed)
Patient ID: Joy Nelson, female   DOB: [birth year], 41 y.o.   MRN: [id number] [facility] Progress Note:   * No surgery found *  Subjective: Mental status is alert and clear Objective: Vital signs in last 24 hours: Temp:  [98 F (36.7 C)-98.7 F (37.1 C)] 98 F (36.7 C) (01/25 0617) Pulse Rate:  [51-80] 57 (01/25 0617) Resp:  [18] 18 (01/25 0617) BP: (147-156)/(67-79) 156/67 (01/24 2106) SpO2:  [98 %-100 %] 100 % (01/25 0617)  Intake/Output from previous day: 01/24 0701 - 01/25 0700 In: 1245 [P.O.:240; I.V.:905; IV Piggyback:100] Out: 475 [Urine:475] Intake/Output this shift: No intake/output data recorded.  Physical Exam: Work of breathing is normal.  There  Is a firm and tender area in the left axilla/arm that is consistent with more infection.    Lab Results:  Results for orders placed or performed during the hospital encounter of 04/18/16 (from the past 48 hour(s))  Basic metabolic panel     Status: Abnormal   Collection Time: 04/21/16  5:37 AM  Result Value Ref Range   Sodium 137 135 - 145 mmol/L   Potassium 4.1 3.5 - 5.1 mmol/L   Chloride 100 (L) 101 - 111 mmol/L   CO2 29 22 - 32 mmol/L   Glucose, Bld 93 65 - 99 mg/dL   BUN 10 6 - 20 mg/dL   Creatinine, Ser 0.72 0.44 - 1.00 mg/dL   Calcium 9.3 8.9 - 10.3 mg/dL   GFR calc non Af Amer >60 >60 mL/min   GFR calc Af Amer >60 >60 mL/min    Comment: (NOTE) The eGFR has been calculated using the CKD EPI equation. This calculation has not been validated in all clinical situations. eGFR's persistently <60 mL/min signify possible Chronic Kidney Disease.    Anion gap 8 5 - 15  CBC     Status: Abnormal   Collection Time: 04/21/16  5:37 AM  Result Value Ref Range   WBC 6.0 4.0 - 10.5 K/uL   RBC 3.46 (L) 3.87 - 5.11 MIL/uL   Hemoglobin 11.1 (L) 12.0 - 15.0 g/dL   HCT 34.3 (L) 36.0 - 46.0 %   MCV 99.1 78.0 - 100.0 fL   MCH 32.1 26.0 - 34.0 pg   MCHC 32.4 30.0 - 36.0 g/dL   RDW 13.0 11.5 - 15.5 %   Platelets  327 150 - 400 K/uL  Vancomycin, trough     Status: Abnormal   Collection Time: 04/21/16  5:02 PM  Result Value Ref Range   Vancomycin Tr 11 (L) 15 - 20 ug/mL  Basic metabolic panel     Status: None   Collection Time: 04/22/16  6:02 AM  Result Value Ref Range   Sodium 138 135 - 145 mmol/L   Potassium 4.0 3.5 - 5.1 mmol/L   Chloride 102 101 - 111 mmol/L   CO2 28 22 - 32 mmol/L   Glucose, Bld 94 65 - 99 mg/dL   BUN 12 6 - 20 mg/dL   Creatinine, Ser 0.61 0.44 - 1.00 mg/dL   Calcium 9.0 8.9 - 10.3 mg/dL   GFR calc non Af Amer >60 >60 mL/min   GFR calc Af Amer >60 >60 mL/min    Comment: (NOTE) The eGFR has been calculated using the CKD EPI equation. This calculation has not been validated in all clinical situations. eGFR's persistently <60 mL/min signify possible Chronic Kidney Disease.    Anion gap 8 5 - 15    Radiology/Results: No results found.  Anti-infectives: Anti-infectives    Start     Dose/Rate Route Frequency Ordered Stop   04/19/16 1800  vancomycin (VANCOCIN) IVPB 1000 mg/200 mL premix     1,000 mg 200 mL/hr over 60 Minutes Intravenous Every 12 hours 04/19/16 1048     04/19/16 1400  aztreonam (AZACTAM) 1 g in dextrose 5 % 50 mL IVPB  Status:  Discontinued     1 g 100 mL/hr over 30 Minutes Intravenous Every 8 hours 04/19/16 1231 04/19/16 1318   04/19/16 1400  aztreonam (AZACTAM) 1 GM IVPB     1 g 100 mL/hr over 30 Minutes Intravenous Every 8 hours 04/19/16 1318     04/18/16 2200  vancomycin (VANCOCIN) 500 mg in sodium chloride 0.9 % 100 mL IVPB  Status:  Discontinued     500 mg 100 mL/hr over 60 Minutes Intravenous Every 12 hours 04/18/16 0946 04/19/16 1048   04/18/16 1300  vancomycin (VANCOCIN) IVPB 1000 mg/200 mL premix  Status:  Discontinued     1,000 mg 200 mL/hr over 60 Minutes Intravenous  Once 04/18/16 1253 04/18/16 1302   04/18/16 0930  vancomycin (VANCOCIN) IVPB 1000 mg/200 mL premix     1,000 mg 200 mL/hr over 60 Minutes Intravenous  Once 04/18/16 0920  04/18/16 1333   04/18/16 0645  clindamycin (CLEOCIN) IVPB 900 mg     900 mg 100 mL/hr over 30 Minutes Intravenous  Once 04/18/16 8288 04/18/16 3374      Assessment/Plan: Problem List: Patient Active Problem List   Diagnosis Date Noted  . Cellulitis of arm, left 04/18/2016  . Abscess 04/18/2016  . Unruptured cerebral aneurysm 12/19/2015  . Migraine with aura and without status migrainosus, not intractable 12/19/2015    Plan to I & D and get culture.  Patient aware and ready to proceed to OR this AM * No surgery found *    LOS: 4 days   Matt B. Hassell Done, MD, [facility] [phone] beeper [phone]  04/22/2016 8:19 AM

## 2016-04-22 NOTE — Anesthesia Procedure Notes (Signed)
Procedure Name: Intubation Date/Time: 04/22/2016 12:19 PM Performed by: Jarvis NewcomerARMISTEAD, Arica Bevilacqua A Pre-anesthesia Checklist: Patient identified, Timeout performed, Emergency Drugs available, Suction available and Patient being monitored Patient Re-evaluated:Patient Re-evaluated prior to inductionOxygen Delivery Method: Circle system utilized Preoxygenation: Pre-oxygenation with 100% oxygen Intubation Type: IV induction Ventilation: Mask ventilation without difficulty LMA: LMA with gastric port inserted LMA Size: 4.0 Number of attempts: 1 Placement Confirmation: positive ETCO2 and breath sounds checked- equal and bilateral Tube secured with: Tape Dental Injury: Teeth and Oropharynx as per pre-operative assessment

## 2016-04-23 LAB — CULTURE, BLOOD (ROUTINE X 2): Culture: NO GROWTH

## 2016-04-23 LAB — BASIC METABOLIC PANEL
ANION GAP: 7 (ref 5–15)
BUN: 12 mg/dL (ref 6–20)
CHLORIDE: 102 mmol/L (ref 101–111)
CO2: 29 mmol/L (ref 22–32)
Calcium: 8.6 mg/dL — ABNORMAL LOW (ref 8.9–10.3)
Creatinine, Ser: 0.66 mg/dL (ref 0.44–1.00)
GFR calc Af Amer: >60 mL/min (ref >60)
GLUCOSE: 109 mg/dL — AB (ref 65–99)
Potassium: 4.3 mmol/L (ref 3.5–5.1)
Sodium: 138 mmol/L (ref 135–145)

## 2016-04-23 LAB — CBC
HEMATOCRIT: 35.2 % — AB (ref 36.0–46.0)
HEMOGLOBIN: 11.2 g/dL — AB (ref 12.0–15.0)
MCH: 31.7 pg (ref 26.0–34.0)
MCHC: 31.8 g/dL (ref 30.0–36.0)
MCV: 99.7 fL (ref 78.0–100.0)
Platelets: 359 10*3/uL (ref 150–400)
RBC: 3.53 MIL/uL — ABNORMAL LOW (ref 3.87–5.11)
RDW: 13.2 % (ref 11.5–15.5)
WBC: 6.5 10*3/uL (ref 4.0–10.5)

## 2016-04-23 MED ORDER — OXYCODONE HCL 5 MG PO TABS
10.0000 mg | ORAL_TABLET | Freq: Four times a day (QID) | ORAL | Status: DC
Start: 2016-04-23 — End: 2016-04-24
  Administered 2016-04-23 – 2016-04-24 (×7): 10 mg via ORAL
  Filled 2016-04-23 (×7): qty 2

## 2016-04-23 NOTE — Care Management Note (Signed)
Case Management Note  Patient Details  Name: Joy Nelson MRN: [id number] Date of Birth: 02-05-1976  Subjective/Objective: 41 y/o f admitted w/L arm cellulitis. From home. [facility] provided w/[facility] agency list-[facility] chosen for [facility]-dsg changes,will need HH CSW-resources,[id number]-await orders. [facility] rep Selena BattenKim already following.                   Action/Plan:d/c plan home w/HHC.   Expected Discharge Date:                  Expected Discharge Plan:  Home w Home Health Services  In-House Referral:     Discharge planning Services  CM Consult  Post Acute Care Choice:    Choice offered to:  Patient  DME Arranged:    DME Agency:     HH Arranged:    HH Agency:  [facility]  Status of Service:  In process, will continue to follow  If discussed at Long Length of Stay Meetings, dates discussed:    Additional Comments:  Joy Nelson, Joy Blatchley, RN 04/23/2016, 2:29 PM

## 2016-04-23 NOTE — Progress Notes (Signed)
Patient requested that her dressing not be changed tonight. She stated she wanted the doctor to see her again before it was changed.

## 2016-04-23 NOTE — Progress Notes (Signed)
1 Day Post-Op  Subjective: Stable, erythema is better, but not resolved.    Objective: Vital signs in last 24 hours: Temp:  [98.3 F (36.8 C)-98.9 F (37.2 C)] 98.5 F (36.9 C) (01/26 0300) Pulse Rate:  [51-69] 64 (01/26 0909) Resp:  [10-16] 16 (01/26 0300) BP: (126-158)/(58-90) 132/76 (01/26 0300) SpO2:  [94 %-100 %] 100 % (01/26 0300) Last BM Date: 04/20/16 180 PO 2950 IV 500 urine recorded Afebrile, VSS Labs are normal Specimen Description ABSCESS LEFT AXILLA   Special Requests NONE   Gram Stain ABUNDANT WBC PRESENT,BOTH PMN AND MONONUCLEAR  MODERATE GRAM POSITIVE COCCI IN CLUSTERS       Intake/Output from previous day: 01/25 0701 - 01/26 0700 In: 4130 [P.O.:180; I.V.:2850; IV Piggyback:1100] Out: 505 [Urine:500; Blood:5] Intake/Output this shift: No intake/output data recorded.  General appearance: alert, cooperative and no distress Skin: Skin color, texture, turgor normal. No rashes or lesions or open sites still have some purulent drainage.  erythema is better.  Lab Results:   Recent Labs  04/22/16 1459 04/23/16 0602  WBC 5.7 6.5  HGB 11.7* 11.2*  HCT 35.8* 35.2*  PLT 368 359    BMET  Recent Labs  04/22/16 0602 04/22/16 1459 04/23/16 0602  NA 138  --  138  K 4.0  --  4.3  CL 102  --  102  CO2 28  --  29  GLUCOSE 94  --  109*  BUN 12  --  12  CREATININE 0.61 0.56 0.66  CALCIUM 9.0  --  8.6*   PT/INR No results for input(s): LABPROT, INR in the last 72 hours.   Recent Labs Lab 04/19/16 0541  AST 13*  ALT 10*  ALKPHOS 62  BILITOT 0.3  PROT 6.0*  ALBUMIN 3.0*     Lipase  No results found for: LIPASE   Studies/Results: No results found.  Medications: . aztreonam  1 g Intravenous Q8H  . gabapentin  100 mg Oral TID  . heparin  5,000 Units Subcutaneous Q8H  . Influenza vac split quadrivalent PF  0.5 mL Intramuscular Tomorrow-1000  . ipratropium  2 spray Each Nare BID  . lisinopril  10 mg Oral Daily  . montelukast  10 mg Oral  Daily  . pantoprazole  40 mg Oral Daily  . propranolol ER  80 mg Oral Daily  . sodium chloride flush  3 mL Intravenous Q12H  . vancomycin  1,000 mg Intravenous Q12H    Assessment/Plan Left axillary abscess  I & D of left axillary abscess with culture, 04/12/16, Dr. Luretha MurphyMatthew Martin Chronic back pain with degenerative disc disease. 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  Start BID dressing changes, set up home health if she needs it.  Culture is still pending.  Once the cultures are back transition to PO abx and home.         LOS: 5 days    Joy Nelson 04/23/2016 [phone]

## 2016-04-23 NOTE — Progress Notes (Signed)
PROGRESS NOTE    Joy Nelson   ZOX:[id number]  DOB: Sep 20, 1975  DOA: 04/18/2016 PCP: Daune Perch, NP   Brief Narrative:  41 y.o.femalewith medical history significant of hypertension and sjogren's disease who presents with left axillary pain, workup significant for left axillary abscess, status post I&D in ED.   Subjective: Pain in left axilla- agreeing to start oral pain meds.   Assessment & Plan: Sepsis, due to abscesses and cellulitis. -   fever and hypotension with leukocytosis. - S/P I&D in ED, unfortunately no wound cultures sent, continue with IV vancomycin, and IV aztreonam (penicillin allergy) -  Taken to OR on 1/25 for I and D of second site which was found to be communicating with 1st site - blood cultures- NGTD - Surgery consult appreciated   Acute kidney injury.  -Probably prerenal, associated with cellulitis and sepsis, resolved with IV fluid  Hypertension.  - Will continue propanolol & lisinopril   GERD.  - Continue omeprazole.   DVT prophylaxis: Lovenox Code Status: Full code Family Communication:  Disposition Plan: home when stable Consultants:   Gen sugery Procedures:   I and D Antimicrobials:  Anti-infectives    Start     Dose/Rate Route Frequency Ordered Stop   04/19/16 1800  vancomycin (VANCOCIN) IVPB 1000 mg/200 mL premix     1,000 mg 200 mL/hr over 60 Minutes Intravenous Every 12 hours 04/19/16 1048     04/19/16 1400  aztreonam (AZACTAM) 1 g in dextrose 5 % 50 mL IVPB  Status:  Discontinued     1 g 100 mL/hr over 30 Minutes Intravenous Every 8 hours 04/19/16 1231 04/19/16 1318   04/19/16 1400  aztreonam (AZACTAM) 1 GM IVPB     1 g 100 mL/hr over 30 Minutes Intravenous Every 8 hours 04/19/16 1318     04/18/16 2200  vancomycin (VANCOCIN) 500 mg in sodium chloride 0.9 % 100 mL IVPB  Status:  Discontinued     500 mg 100 mL/hr over 60 Minutes Intravenous Every 12 hours 04/18/16 0946 04/19/16 1048   04/18/16 1300   vancomycin (VANCOCIN) IVPB 1000 mg/200 mL premix  Status:  Discontinued     1,000 mg 200 mL/hr over 60 Minutes Intravenous  Once 04/18/16 1253 04/18/16 1302   04/18/16 0930  vancomycin (VANCOCIN) IVPB 1000 mg/200 mL premix     1,000 mg 200 mL/hr over 60 Minutes Intravenous  Once 04/18/16 0920 04/18/16 1333   04/18/16 0645  clindamycin (CLEOCIN) IVPB 900 mg     900 mg 100 mL/hr over 30 Minutes Intravenous  Once 04/18/16 0637 04/18/16 0821       Objective: Vitals:   04/22/16 1410 04/22/16 2042 04/23/16 0300 04/23/16 0909  BP: (!) 127/58 128/67 132/76   Pulse: 69 60 (!) 51 64  Resp: 12 12 16    Temp: 98.9 F (37.2 C) 98.7 F (37.1 C) 98.5 F (36.9 C)   TempSrc:  Oral Oral   SpO2: 98% 98% 100%   Weight:      Height:        Intake/Output Summary (Last 24 hours) at 04/23/16 1245 Last data filed at 04/23/16 0600  Gross per 24 hour  Intake             4130 ml  Output              500 ml  Net             3630 ml   Filed Weights   04/18/16 0333  Weight: 70.3 kg (155 lb)    Examination: General exam: Appears comfortable  HEENT: PERRLA, oral mucosa moist, no sclera icterus or thrush Respiratory system: Clear to auscultation. Respiratory effort normal. Cardiovascular system: S1 & S2 heard, RRR.  No murmurs  Gastrointestinal system: Abdomen soft, non-tender, nondistended. Normal bowel sound. No organomegaly Central nervous system: Alert and oriented. No focal neurological deficits. Extremities: No cyanosis, clubbing or edema Skin: cellulitis and abscess - drained and packed Psychiatry:  Mood & affect appropriate.     Data Reviewed: I have personally reviewed following labs and imaging studies  CBC:  Recent Labs Lab 04/18/16 0756 04/19/16 0541 04/20/16 0542 04/21/16 0537 04/22/16 1459 04/23/16 0602  WBC 17.0* 10.8* 7.7 6.0 5.7 6.5  NEUTROABS 13.4*  --   --   --   --   --   HGB 11.2* 10.2* 10.6* 11.1* 11.7* 11.2*  HCT 34.2* 31.5* 31.6* 34.3* 35.8* 35.2*  MCV 98.6  101.9* 98.1 99.1 98.6 99.7  PLT 269 252 250 327 368 359   Basic Metabolic Panel:  Recent Labs Lab 04/19/16 0541 04/20/16 0542 04/21/16 0537 04/22/16 0602 04/22/16 1459 04/23/16 0602  NA 136 137 137 138  --  138  K 4.2 4.7 4.1 4.0  --  4.3  CL 110 105 100* 102  --  102  CO2 20* 23 29 28   --  29  GLUCOSE 94 86 93 94  --  109*  BUN 11 10 10 12   --  12  CREATININE 0.68 0.61 0.72 0.61 0.56 0.66  CALCIUM 8.2* 8.5* 9.3 9.0  --  8.6*   GFR: Estimated Creatinine Clearance: 85.9 mL/min (by C-G formula based on SCr of 0.66 mg/dL). Liver Function Tests:  Recent Labs Lab 04/19/16 0541  AST 13*  ALT 10*  ALKPHOS 62  BILITOT 0.3  PROT 6.0*  ALBUMIN 3.0*   No results for input(s): LIPASE, AMYLASE in the last 168 hours. No results for input(s): AMMONIA in the last 168 hours. Coagulation Profile: No results for input(s): INR, PROTIME in the last 168 hours. Cardiac Enzymes: No results for input(s): CKTOTAL, CKMB, CKMBINDEX, TROPONINI in the last 168 hours. BNP (last 3 results) No results for input(s): PROBNP in the last 8760 hours. HbA1C: No results for input(s): HGBA1C in the last 72 hours. CBG: No results for input(s): GLUCAP in the last 168 hours. Lipid Profile: No results for input(s): CHOL, HDL, LDLCALC, TRIG, CHOLHDL, LDLDIRECT in the last 72 hours. Thyroid Function Tests: No results for input(s): TSH, T4TOTAL, FREET4, T3FREE, THYROIDAB in the last 72 hours. Anemia Panel: No results for input(s): VITAMINB12, FOLATE, FERRITIN, TIBC, IRON, RETICCTPCT in the last 72 hours. Urine analysis: No results found for: COLORURINE, APPEARANCEUR, LABSPEC, PHURINE, GLUCOSEU, HGBUR, BILIRUBINUR, KETONESUR, PROTEINUR, UROBILINOGEN, NITRITE, LEUKOCYTESUR Sepsis Labs: @LABRCNTIP (procalcitonin:4,lacticidven:4) ) Recent Results (from the past 240 hour(s))  Blood Culture (routine x 2)     Status: None   Collection Time: 04/18/16 11:30 AM  Result Value Ref Range Status   Specimen  Description BLOOD RIGHT ARM  Final   Special Requests BOTTLES DRAWN AEROBIC AND ANAEROBIC 5CC  Final   Culture   Final    NO GROWTH 5 DAYS Performed at [facility], [address], [place], [place] [address]    Report Status 04/23/2016 FINAL  Final  MRSA PCR Screening     Status: None   Collection Time: 04/18/16  1:44 PM  Result Value Ref Range Status   MRSA by PCR NEGATIVE NEGATIVE Final  Comment:        The GeneXpert MRSA Assay (FDA approved for NASAL specimens only), is one component of a comprehensive MRSA colonization surveillance program. It is not intended to diagnose MRSA infection nor to guide or monitor treatment for MRSA infections.   Blood Culture (routine x 2)     Status: None (Preliminary result)   Collection Time: 04/19/16  5:41 AM  Result Value Ref Range Status   Specimen Description BLOOD LEFT HAND  Final   Special Requests IN PEDIATRIC BOTTLE 2.5 CC  Final   Culture   Final    NO GROWTH 4 DAYS Performed at [facility], [address] [place], [place] [address]    Report Status PENDING  Incomplete  Aerobic/Anaerobic Culture (surgical/deep wound)     Status: None (Preliminary result)   Collection Time: 04/22/16 12:52 PM  Result Value Ref Range Status   Specimen Description ABSCESS LEFT AXILLA  Final   Special Requests NONE  Final   Gram Stain   Final    ABUNDANT WBC PRESENT,BOTH PMN AND MONONUCLEAR MODERATE GRAM POSITIVE COCCI IN CLUSTERS    Culture   Final    CULTURE REINCUBATED FOR BETTER GROWTH Performed at [facility], [address] [place], [place] [address]    Report Status PENDING  Incomplete         Radiology Studies: No results found.    Scheduled Meds: . aztreonam  1 g Intravenous Q8H  . gabapentin  100 mg Oral TID  . heparin  5,000 Units Subcutaneous Q8H  . Influenza vac split quadrivalent PF  0.5 mL Intramuscular Tomorrow-1000  . ipratropium  2 spray Each Nare BID  . lisinopril  10 mg Oral Daily  .  montelukast  10 mg Oral Daily  . oxyCODONE  10 mg Oral QID  . pantoprazole  40 mg Oral Daily  . propranolol ER  80 mg Oral Daily  . sodium chloride flush  3 mL Intravenous Q12H  . vancomycin  1,000 mg Intravenous Q12H   Continuous Infusions: . sodium chloride 100 mL/hr at 04/22/16 2256     LOS: 5 days    Time spent in minutes: 35    Rozena Fierro, MD [facility] Pager: [email or web address] Password [email or web address] 04/23/2016, 12:45 PM

## 2016-04-23 NOTE — Discharge Instructions (Signed)
Mechanical Wound Debridement Shower and remove current dressing.  Allow soap and water into the sites Repack with 1/4 inch iodoform to base of each wound. Dry dressing and paper tape to hold dressing in place. Introduction Mechanical wound debridement is a treatment to remove dead tissue from a wound. This helps the wound heal. The treatment involves cleaning the wound (irrigation) and using a pad or gauze (dressing) to remove dead tissue and debris from the wound. There are different types of mechanical wound debridement. Depending on the wound, you may need to repeat this procedure or change to another form of debridement as your wound starts to heal. Tell a health care provider about:  Any allergies you have.  All medicines you are taking, including vitamins, herbs, eye drops, creams, and over-the-counter medicines.  Any blood disorders you have.  Any medical conditions you have, including any conditions that:  Cause a significant decrease in blood circulation to the part of the body where the wound is, such as peripheral vascular disease.  Compromise your defense (immune) system or white blood count.  Any surgeries you have had.  Whether you are pregnant or may be pregnant. What are the risks? Generally, this is a safe procedure. However, problems may occur, including:  Infection.  Bleeding.  Damage to healthy tissue in and around your wound.  Soreness or pain.  Failure of the wound to heal.  Scarring. What happens before the procedure? You may be given antibiotic medicine to help prevent infection. What happens during the procedure?  Your health care provider may apply a numbing medicine (topical anesthetic) to the wound.  Your health care provider will irrigate your wound with a germ-free (sterile), salt-water (saline) solution. This removes debris, bacteria, and dead tissue.  Depending on what type of mechanical wound debridement you are having, your health care  provider may do one of the following:  Put a dressing on your wound. You may have dry gauze pad placed into the wound. Your health care provider will remove the gauze after the wound is dry. Any dead tissue and debris that has dried into the gauze will be lifted out of the wound (wet-to-dry debridement).  Use a type of pad (monofilament fiber debridement pad). This pad has a fluffy surface on one side that picks up dead tissue and debris from your wound. Your health care provider wets the pad and wipes it over your wound for several minutes.  Irrigate your wound with a pressurized stream of solution such as saline or water.  Once your health care provider is finished, he or she may apply a light dressing to your wound. The procedure may vary among health care providers and hospitals. What happens after the procedure?  You may receive medicine for pain.  You will continue to receive antibiotic medicine if it was started before your procedure. This information is not intended to replace advice given to you by your health care provider. Make sure you discuss any questions you have with your health care provider. Document Released: 12/04/2014 Document Revised: 08/21/2015 Document Reviewed: 07/24/2014  2017 Elsevier

## 2016-04-24 DIAGNOSIS — M797 Fibromyalgia: Secondary | ICD-10-CM

## 2016-04-24 DIAGNOSIS — M539 Dorsopathy, unspecified: Secondary | ICD-10-CM

## 2016-04-24 DIAGNOSIS — Z8739 Personal history of other diseases of the musculoskeletal system and connective tissue: Secondary | ICD-10-CM

## 2016-04-24 DIAGNOSIS — G43109 Migraine with aura, not intractable, without status migrainosus: Secondary | ICD-10-CM

## 2016-04-24 DIAGNOSIS — I10 Essential (primary) hypertension: Secondary | ICD-10-CM

## 2016-04-24 DIAGNOSIS — M329 Systemic lupus erythematosus, unspecified: Secondary | ICD-10-CM

## 2016-04-24 LAB — CBC
HEMATOCRIT: 36 % (ref 36.0–46.0)
HEMOGLOBIN: 11.7 g/dL — AB (ref 12.0–15.0)
MCH: 31.9 pg (ref 26.0–34.0)
MCHC: 32.5 g/dL (ref 30.0–36.0)
MCV: 98.1 fL (ref 78.0–100.0)
Platelets: 355 10*3/uL (ref 150–400)
RBC: 3.67 MIL/uL — ABNORMAL LOW (ref 3.87–5.11)
RDW: 12.9 % (ref 11.5–15.5)
WBC: 5.8 10*3/uL (ref 4.0–10.5)

## 2016-04-24 LAB — CULTURE, BLOOD (ROUTINE X 2): Culture: NO GROWTH

## 2016-04-24 LAB — BASIC METABOLIC PANEL
ANION GAP: 8 (ref 5–15)
BUN: 17 mg/dL (ref 6–20)
CALCIUM: 9 mg/dL (ref 8.9–10.3)
CO2: 27 mmol/L (ref 22–32)
CREATININE: 0.63 mg/dL (ref 0.44–1.00)
Chloride: 101 mmol/L (ref 101–111)
GFR calc non Af Amer: >60 mL/min (ref >60)
GLUCOSE: 92 mg/dL (ref 65–99)
Potassium: 4.2 mmol/L (ref 3.5–5.1)
Sodium: 136 mmol/L (ref 135–145)

## 2016-04-24 MED ORDER — OXYCODONE HCL 10 MG PO TABS: 10.0000 mg | ORAL_TABLET | Freq: Four times a day (QID) | ORAL | 0 refills | Status: DC

## 2016-04-24 MED ORDER — CLINDAMYCIN HCL 300 MG PO CAPS: 300.0000 mg | ORAL_CAPSULE | Freq: Three times a day (TID) | ORAL | 0 refills | Status: DC

## 2016-04-24 MED ORDER — GABAPENTIN 100 MG PO CAPS: 100.0000 mg | ORAL_CAPSULE | Freq: Three times a day (TID) | ORAL | 0 refills | Status: DC

## 2016-04-24 NOTE — Care Management Note (Addendum)
Case Management Note  Patient Details  Name: Joy Nelson MRN: [id number] Date of Birth: [birth year]  Subjective/Objective:   Sepsis, due to abscesses and cellulitis                 Action/Plan: Discharge Planning: AVS reviewed: Chart reviewed. HH arranged with [facility]. Contacted [facility] Liaison with referral.   PCP Loura BackBLUNT-EVANS, LACHELLE A MD  Expected Discharge Date:  04/24/16               Expected Discharge Plan:  Home w Home Health Services  In-House Referral:  NA  Discharge planning Services  CM Consult  Post Acute Care Choice:  Home Health Choice offered to:  Patient  DME Arranged:  N/A DME Agency:  NA  HH Arranged:  RN HH Agency:  [facility]  Status of Service:  Completed, signed off  If discussed at Long Length of Stay Meetings, dates discussed:    Additional Comments:  Elliot CousinShavis, Joseeduardo Brix Ellen, RN 04/24/2016, 11:32 AM

## 2016-04-24 NOTE — Progress Notes (Signed)
Discharge ordered after last dose of Vancomycin.  Dressing to left axilla changed at 1545.  Educated pt on cleansing and packing of wound.  Teach back completed.  Left over supplies given to pt.  Will discharge as ordered following IV antibiotic.

## 2016-04-24 NOTE — Progress Notes (Signed)
Patient ID: Joy Nelson, female   DOB: February 22, 1976, 41 y.o.   MRN: [id number] 2 Days Post-Op   Subjective: States she feels better than before the surgery but still a lot of pain requiring IV Dilaudid.  Objective: Vital signs in last 24 hours: Temp:  [98.5 F (36.9 C)-99.4 F (37.4 C)] 98.5 F (36.9 C) (01/27 0612) Pulse Rate:  [56-60] 57 (01/27 0612) Resp:  [16-18] 16 (01/27 0612) BP: (121-138)/(69-82) 121/69 (01/27 0612) SpO2:  [98 %-99 %] 98 % (01/27 0612) Last BM Date: 04/22/16  Intake/Output from previous day: 01/26 0701 - 01/27 0700 In: 2658.3 [I.V.:2258.3; IV Piggyback:400] Out: -  Intake/Output this shift: No intake/output data recorded.  General appearance: alert, cooperative and no distress Incision/Wound: 2I and D sites recently repacked with iodoform. Minimal erythema but still 1-2 cm of induration around each and very tender.  Lab Results:   Recent Labs  04/23/16 0602 04/24/16 0744  WBC 6.5 5.8  HGB 11.2* 11.7*  HCT 35.2* 36.0  PLT 359 355   BMET  Recent Labs  04/23/16 0602 04/24/16 0744  NA 138 136  K 4.3 4.2  CL 102 101  CO2 29 27  GLUCOSE 109* 92  BUN 12 17  CREATININE 0.66 0.63  CALCIUM 8.6* 9.0     Studies/Results: No results found.  Anti-infectives: Anti-infectives    Start     Dose/Rate Route Frequency Ordered Stop   04/19/16 1800  vancomycin (VANCOCIN) IVPB 1000 mg/200 mL premix     1,000 mg 200 mL/hr over 60 Minutes Intravenous Every 12 hours 04/19/16 1048     04/19/16 1400  aztreonam (AZACTAM) 1 g in dextrose 5 % 50 mL IVPB  Status:  Discontinued     1 g 100 mL/hr over 30 Minutes Intravenous Every 8 hours 04/19/16 1231 04/19/16 1318   04/19/16 1400  aztreonam (AZACTAM) 1 GM IVPB     1 g 100 mL/hr over 30 Minutes Intravenous Every 8 hours 04/19/16 1318     04/18/16 2200  vancomycin (VANCOCIN) 500 mg in sodium chloride 0.9 % 100 mL IVPB  Status:  Discontinued     500 mg 100 mL/hr over 60 Minutes Intravenous Every 12 hours  04/18/16 0946 04/19/16 1048   04/18/16 1300  vancomycin (VANCOCIN) IVPB 1000 mg/200 mL premix  Status:  Discontinued     1,000 mg 200 mL/hr over 60 Minutes Intravenous  Once 04/18/16 1253 04/18/16 1302   04/18/16 0930  vancomycin (VANCOCIN) IVPB 1000 mg/200 mL premix     1,000 mg 200 mL/hr over 60 Minutes Intravenous  Once 04/18/16 0920 04/18/16 1333   04/18/16 0645  clindamycin (CLEOCIN) IVPB 900 mg     900 mg 100 mL/hr over 30 Minutes Intravenous  Once 04/18/16 0637 04/18/16 [id number]      Assessment/Plan: s/p Procedure(s): IRRIGATION AND DEBRIDEMENT LEFT UPPER ARM POSSIBLE ABCESS Objective signs of infection improving but still having a lot of pain. Would try to continue IV antibiotics today (IV access and issue). Hopefully can transition to oral antibiotics and oral pain medications soon. Continue current wound care.   LOS: 6 days    Amando Chaput T 04/24/2016

## 2016-04-24 NOTE — Progress Notes (Signed)
Called to pt room, pt c/o IV to right wrist (infusing Vancomycin) "burning".  IV consult ordered and IV infusion stopped.  Will finish infusion once new access is established.

## 2016-04-24 NOTE — Discharge Summary (Signed)
Physician Discharge Summary  Joy Nelson GNF:[id number] DOB: 09/29/1975 DOA: 04/18/2016  PCP: Daune Perch, NP  Admit date: 04/18/2016 Discharge date: 04/24/2016  Admitted From: home  Disposition:  home   Recommendations for Outpatient Follow-up:  1. F/u for c diff if she develops diarrhea  Home Health:  ordered  Equipment/Devices:      Discharge Condition:  stable   CODE STATUS:  Full code   Diet recommendation:   Heart healthy Consultations:  gen surgery    Discharge Diagnoses:  Principal Problem:   Abscess/   Cellulitis of arm, left Active Problems:   Unruptured cerebral aneurysm   Migraine with aura and without status migrainosus, not intractable   Fibromyalgia   History of systemic lupus erythematosus (SLE)   Benign essential HTN   Multilevel degenerative disc disease    Subjective: No complaints. Pain is controlled with current medications.   Brief Summary: 41 y.o.femalewith medical history significant of hypertension and sjogren's disease who presents with left axillary pain, workup significant for left axillary abscess, status post I&D in ED and again on 1/25.    Hospital Course:  Sepsis, due to abscesses and cellulitis. -   fever and hypotension with leukocytosis. - S/P I&D in ED, unfortunately no wound cultures sent- has been receiving IV vancomycin and aztreonam (penicillin allergy) -  Taken to OR on 1/25 for I and D of second site which was found to be communicating with 1st site - blood cultures x 2 sets - NGTD - wound cultures growing gr + cocci in clusters, likely staph -will receive today's doses of Vanc (this evening) and Azactam and then be discharged later this evening with Clindamycin (allegic to Septra, Doxycyline is not as effective as Clindamycin for MRSA coverage)- cultures still pending but due to MRSA screen being +, will ensure she has MRSA coverage - high risk for c diff and therefore strictly advised to return to hospital if  she developed diarrhea.  - needs to f/u with surgery later this week- has learned how to pack wound and has a friend who will help her - have prescribed 10 days of antibiotics- she has had 1 wk in the hospital   Acute kidney injury.  -Probably prerenal, associated with cellulitis and sepsis, resolved with IV fluid  Hypertension.  - Will continue propanolol & lisinopril   Migraines - on Propranolol   Fibromyalgia - cont Neurontin  SLE - has not found a rheumatologist since she has moved to [facility] - have giving her contact number for local rheumatologist to establish care  GERD.  - Continue omeprazole.   Discharge Instructions  Discharge Instructions    Diet - low sodium heart healthy    Complete by:  As directed    Increase activity slowly    Complete by:  As directed      Allergies as of 04/24/2016      Reactions   Methotrexate Derivatives Other (See Comments)   Flushing    Celecoxib Swelling   Morphine And Related Itching   Other    Imitrex   Penicillins Other (See Comments)   Childhood reaction.  Turns blue and has convulsions.   Sulfa Antibiotics Swelling   Tape Rash   Can have paper tape.       Medication List    TAKE these medications   clindamycin 300 MG capsule Commonly known as:  CLEOCIN Take 1 capsule (300 mg total) by mouth 3 (three) times daily.   diphenhydrAMINE 25 mg capsule Commonly  known as:  BENADRYL Take 25 mg by mouth at bedtime as needed for allergies.   gabapentin 100 MG capsule Commonly known as:  NEURONTIN Take 1 capsule (100 mg total) by mouth 3 (three) times daily.   ipratropium 0.06 % nasal spray Commonly known as:  ATROVENT Place 2 sprays into both nostrils 4 (four) times daily. What changed:  when to take this   lisinopril 10 MG tablet Commonly known as:  PRINIVIL,ZESTRIL Take 1 tablet (10 mg total) by mouth daily.   montelukast 10 MG tablet Commonly known as:  SINGULAIR Take 10 mg by mouth daily.   omeprazole  20 MG capsule Commonly known as:  PRILOSEC Take 20 mg by mouth daily.   Oxycodone HCl 10 MG Tabs Take 1 tablet (10 mg total) by mouth 4 (four) times daily.   propranolol ER 80 MG 24 hr capsule Commonly known as:  INDERAL LA Take 1 capsule (80 mg total) by mouth daily.      Follow-up Information    Valarie MerinoMARTIN,MATTHEW B, MD Follow up.   Specialty:  General Surgery Why:  Call and set up appointment by Friday.  Be at the office 30 minutes early for check in.  Continue dressing changes at home as instructed here.   Contact information: [address] [place] [address] [phone]        [facility] Follow [facility].   Contact information: [address] [place] [address] [phone]        [facility] .   Specialty:  Rheumatology         Allergies  Allergen Reactions  . Methotrexate Derivatives Other (See Comments)    Flushing   . Celecoxib Swelling  . Morphine And Related Itching  . Other     Imitrex  . Penicillins Other (See Comments)    Childhood reaction.  Turns blue and has convulsions.  . Sulfa Antibiotics Swelling  . Tape Rash    Can have paper tape.      Procedures/Studies: I and D x 2  Ct Humerus Left W Contrast  Result Date: 04/19/2016 CLINICAL DATA:  Hypertension and shortness disease with left axillary pain. Workup significant for left axillary abscess status post incision and drainage in the ED. Concern for abscess in the arm. EXAM: CT OF THE UPPER LEFT EXTREMITY WITH CONTRAST TECHNIQUE: Multidetector CT imaging of the upper left extremity was performed according to the standard protocol following intravenous contrast administration. COMPARISON:  None. CONTRAST:  100mL ISOVUE-300 IOPAMIDOL (ISOVUE-300) INJECTION 61%<Contrast>16000mL ISOVUE-300 IOPAMIDOL (ISOVUE-300) INJECTION 61% FINDINGS: Bones/Joint/Cartilage Nonacute.  No bone destruction. Ligaments Suboptimally assessed by CT.  Muscles and Tendons There soft tissue induration of the subcutaneous fat from left axilla and coursing over the biceps muscle. Mild reactive edematous change of the biceps muscle without drainable fluid collection or abnormal enhancement identified. Soft tissues Cellulitis of the left axilla tracking along the ventral and medial aspect of the left arm without soft tissue abscess identified. Small reactive lymph nodes in the left axilla measuring up to 6 mm short axis. IMPRESSION: No drainable fluid collections of the left arm. Cellulitis from left axilla to elbow joint with probable mild myositis of the biceps but no pyomyositis. No acute fracture nor bone destruction.  The Electronically Signed   By: Tollie Ethavid  Kwon M.D.   On: 04/19/2016 20:43       Discharge Exam: Vitals:   04/23/16 2206 04/24/16 0612  BP: 126/82 121/69  Pulse: 60 (!) 57  Resp: 18 16  Temp: 98.5 F (36.9 C) 98.5 F (36.9 C)   Vitals:   04/23/16 0909 04/23/16 1318 04/23/16 2206 04/24/16 0612  BP:  138/72 126/82 121/69  Pulse: 64 (!) 56 60 (!) 57  Resp:  18 18 16   Temp:  99.4 F (37.4 C) 98.5 F (36.9 C) 98.5 F (36.9 C)  TempSrc:  Oral Oral Oral  SpO2:  98% 99% 98%  Weight:      Height:        General: Pt is alert, awake, not in acute distress Cardiovascular: RRR, S1/S2 +, no rubs, no gallops Respiratory: CTA bilaterally, no wheezing, no rhonchi Abdominal: Soft, NT, ND, bowel sounds + Extremities: no edema, no cyanosis    The results of significant diagnostics from this hospitalization (including imaging, microbiology, ancillary and laboratory) are listed below for reference.     Microbiology: Recent Results (from the past 240 hour(s))  Blood Culture (routine x 2)     Status: None   Collection Time: 04/18/16 11:30 AM  Result Value Ref Range Status   Specimen Description BLOOD RIGHT ARM  Final   Special Requests BOTTLES DRAWN AEROBIC AND ANAEROBIC 5CC  Final   Culture   Final    NO GROWTH 5  DAYS Performed at [facility], [address], [place], [place] [address]    Report Status 04/23/2016 FINAL  Final  MRSA PCR Screening     Status: None   Collection Time: 04/18/16  1:44 PM  Result Value Ref Range Status   MRSA by PCR NEGATIVE NEGATIVE Final    Comment:        The GeneXpert MRSA Assay (FDA approved for NASAL specimens only), is one component of a comprehensive MRSA colonization surveillance program. It is not intended to diagnose MRSA infection nor to guide or monitor treatment for MRSA infections.   Blood Culture (routine x 2)     Status: None   Collection Time: 04/19/16  5:41 AM  Result Value Ref Range Status   Specimen Description BLOOD LEFT HAND  Final   Special Requests IN PEDIATRIC BOTTLE 2.5 CC  Final   Culture   Final    NO GROWTH 5 DAYS Performed at [facility], [address] [place], [place] [address]    Report Status 04/24/2016 FINAL  Final  Aerobic/Anaerobic Culture (surgical/deep wound)     Status: None (Preliminary result)   Collection Time: 04/22/16 12:52 PM  Result Value Ref Range Status   Specimen Description ABSCESS LEFT AXILLA  Final   Special Requests NONE  Final   Gram Stain   Final    ABUNDANT WBC PRESENT,BOTH PMN AND MONONUCLEAR MODERATE GRAM POSITIVE COCCI IN CLUSTERS Performed at [facility], [address], [place], [place] [address]    Culture   Final    CULTURE REINCUBATED FOR BETTER GROWTH NO ANAEROBES ISOLATED; CULTURE IN PROGRESS FOR 5 DAYS    Report Status PENDING  Incomplete     Labs: BNP (last 3 results) No results for input(s): BNP in the last 8760 hours. Basic Metabolic Panel:  Recent Labs Lab 04/20/16 0542 04/21/16 0537 04/22/16 0602 04/22/16 1459 04/23/16 0602 04/24/16 0744  NA 137 137 138  --  138 136  K 4.7 4.1 4.0  --  4.3 4.2  CL 105 100* 102  --  102 101  CO2 23 29 28   --  29 27  GLUCOSE 86 93 94  --  109* 92  BUN 10 10 12   --  12 17  CREATININE 0.61 0.72 0.61 0.56 0.66  0.63  CALCIUM 8.5* 9.3 9.0  --  8.6* 9.0   Liver Function Tests:  Recent Labs Lab 04/19/16 0541  AST 13*  ALT 10*  ALKPHOS 62  BILITOT 0.3  PROT 6.0*  ALBUMIN 3.0*   No results for input(s): LIPASE, AMYLASE in the last 168 hours. No results for input(s): AMMONIA in the last 168 hours. CBC:  Recent Labs Lab 04/18/16 0756  04/20/16 0542 04/21/16 0537 04/22/16 1459 04/23/16 0602 04/24/16 0744  WBC 17.0*  < > 7.7 6.0 5.7 6.5 5.8  NEUTROABS 13.4*  --   --   --   --   --   --   HGB 11.2*  < > 10.6* 11.1* 11.7* 11.2* 11.7*  HCT 34.2*  < > 31.6* 34.3* 35.8* 35.2* 36.0  MCV 98.6  < > 98.1 99.1 98.6 99.7 98.1  PLT 269  < > 250 327 368 359 355  < > = values in this interval not displayed. Cardiac Enzymes: No results for input(s): CKTOTAL, CKMB, CKMBINDEX, TROPONINI in the last 168 hours. BNP: Invalid input(s): POCBNP CBG: No results for input(s): GLUCAP in the last 168 hours. D-Dimer No results for input(s): DDIMER in the last 72 hours. Hgb A1c No results for input(s): HGBA1C in the last 72 hours. Lipid Profile No results for input(s): CHOL, HDL, LDLCALC, TRIG, CHOLHDL, LDLDIRECT in the last 72 hours. Thyroid function studies No results for input(s): TSH, T4TOTAL, T3FREE, THYROIDAB in the last 72 hours.  Invalid input(s): FREET3 Anemia work up No results for input(s): VITAMINB12, FOLATE, FERRITIN, TIBC, IRON, RETICCTPCT in the last 72 hours. Urinalysis No results found for: COLORURINE, APPEARANCEUR, LABSPEC, PHURINE, GLUCOSEU, HGBUR, BILIRUBINUR, KETONESUR, PROTEINUR, UROBILINOGEN, NITRITE, LEUKOCYTESUR Sepsis Labs Invalid input(s): PROCALCITONIN,  WBC,  LACTICIDVEN Microbiology Recent Results (from the past 240 hour(s))  Blood Culture (routine x 2)     Status: None   Collection Time: 04/18/16 11:30 AM  Result Value Ref Range Status   Specimen Description BLOOD RIGHT ARM  Final   Special Requests BOTTLES DRAWN AEROBIC AND ANAEROBIC 5CC  Final   Culture   Final     NO GROWTH 5 DAYS Performed at [facility], [address] [place], [place] [address]    Report Status 04/23/2016 FINAL  Final  MRSA PCR Screening     Status: None   Collection Time: 04/18/16  1:44 PM  Result Value Ref Range Status   MRSA by PCR NEGATIVE NEGATIVE Final    Comment:        The GeneXpert MRSA Assay (FDA approved for NASAL specimens only), is one component of a comprehensive MRSA colonization surveillance program. It is not intended to diagnose MRSA infection nor to guide or monitor treatment for MRSA infections.   Blood Culture (routine x 2)     Status: None   Collection Time: 04/19/16  5:41 AM  Result Value Ref Range Status   Specimen Description BLOOD LEFT HAND  Final   Special Requests IN PEDIATRIC BOTTLE 2.5 CC  Final   Culture   Final    NO GROWTH 5 DAYS Performed at [facility], [address] [place], [place] [address]    Report Status 04/24/2016 FINAL  Final  Aerobic/Anaerobic Culture (surgical/deep wound)     Status: None (Preliminary result)   Collection Time: 04/22/16 12:52 PM  Result Value Ref Range Status  Specimen Description ABSCESS LEFT AXILLA  Final   Special Requests NONE  Final   Gram Stain   Final    ABUNDANT WBC PRESENT,BOTH PMN AND MONONUCLEAR MODERATE GRAM POSITIVE COCCI IN CLUSTERS Performed at [facility], [address], [place], [place] [address]    Culture   Final    CULTURE REINCUBATED FOR BETTER GROWTH NO ANAEROBES ISOLATED; CULTURE IN PROGRESS FOR 5 DAYS    Report Status PENDING  Incomplete     Time coordinating discharge: Over 30 minutes  SIGNED:   Calvert Cantor, MD  [facility] 04/24/2016, 10:21 AM Pager   If 7PM-7AM, please contact night-coverage [email or web address] Password [email or web address]

## 2016-04-27 LAB — AEROBIC/ANAEROBIC CULTURE W GRAM STAIN (SURGICAL/DEEP WOUND)

## 2016-04-27 LAB — AEROBIC/ANAEROBIC CULTURE (SURGICAL/DEEP WOUND)

## 2016-07-21 ENCOUNTER — Ambulatory Visit: Payer: Medicaid Other | Admitting: Diagnostic Neuroimaging

## 2016-07-21 ENCOUNTER — Ambulatory Visit: Payer: Self-pay | Admitting: Diagnostic Neuroimaging

## 2016-07-22 ENCOUNTER — Encounter: Payer: Self-pay | Admitting: Diagnostic Neuroimaging

## 2016-10-05 ENCOUNTER — Emergency Department (HOSPITAL_COMMUNITY): Payer: Medicaid Other

## 2016-10-05 ENCOUNTER — Emergency Department (HOSPITAL_COMMUNITY)
Admission: EM | Admit: 2016-10-05 | Discharge: 2016-10-05 | Disposition: A | Discharge status: Home / Self Care | Payer: Medicaid Other | Attending: Emergency Medicine | Admitting: Emergency Medicine

## 2016-10-05 DIAGNOSIS — R1030 Lower abdominal pain, unspecified: Secondary | ICD-10-CM | POA: Diagnosis not present

## 2016-10-05 DIAGNOSIS — N3001 Acute cystitis with hematuria: Secondary | ICD-10-CM

## 2016-10-05 DIAGNOSIS — I10 Essential (primary) hypertension: Secondary | ICD-10-CM | POA: Insufficient documentation

## 2016-10-05 DIAGNOSIS — Z79899 Other long term (current) drug therapy: Secondary | ICD-10-CM | POA: Insufficient documentation

## 2016-10-05 DIAGNOSIS — L299 Pruritus, unspecified: Secondary | ICD-10-CM

## 2016-10-05 DIAGNOSIS — F17210 Nicotine dependence, cigarettes, uncomplicated: Secondary | ICD-10-CM | POA: Insufficient documentation

## 2016-10-05 DIAGNOSIS — F909 Attention-deficit hyperactivity disorder, unspecified type: Secondary | ICD-10-CM | POA: Diagnosis not present

## 2016-10-05 LAB — URINALYSIS, ROUTINE W REFLEX MICROSCOPIC
Bilirubin Urine: NEGATIVE
Glucose, UA: NEGATIVE mg/dL
Ketones, ur: NEGATIVE mg/dL
Nitrite: NEGATIVE
PROTEIN: 30 mg/dL — AB
SPECIFIC GRAVITY, URINE: 1.019 (ref 1.005–1.030)
pH: 5 (ref 5.0–8.0)

## 2016-10-05 LAB — COMPREHENSIVE METABOLIC PANEL
ALK PHOS: 51 U/L (ref 38–126)
ALT: 13 U/L — AB (ref 14–54)
AST: 23 U/L (ref 15–41)
Albumin: 3.9 g/dL (ref 3.5–5.0)
Anion gap: 7 (ref 5–15)
BUN: 18 mg/dL (ref 6–20)
CHLORIDE: 107 mmol/L (ref 101–111)
CO2: 22 mmol/L (ref 22–32)
CREATININE: 1.1 mg/dL — AB (ref 0.44–1.00)
Calcium: 9 mg/dL (ref 8.9–10.3)
GFR calc Af Amer: >60 mL/min (ref >60)
GFR calc non Af Amer: >60 mL/min (ref >60)
Glucose, Bld: 111 mg/dL — ABNORMAL HIGH (ref 65–99)
Potassium: 3.9 mmol/L (ref 3.5–5.1)
SODIUM: 136 mmol/L (ref 135–145)
Total Bilirubin: 0.6 mg/dL (ref 0.3–1.2)
Total Protein: 6.1 g/dL — ABNORMAL LOW (ref 6.5–8.1)

## 2016-10-05 LAB — CBC WITH DIFFERENTIAL/PLATELET
BASOS ABS: 0 10*3/uL (ref 0.0–0.1)
Basophils Relative: 0 %
Eosinophils Absolute: 0.4 10*3/uL (ref 0.0–0.7)
Eosinophils Relative: 5 %
HCT: 37.5 % (ref 36.0–46.0)
HEMOGLOBIN: 12.4 g/dL (ref 12.0–15.0)
LYMPHS ABS: 2.7 10*3/uL (ref 0.7–4.0)
Lymphocytes Relative: 37 %
MCH: 31.8 pg (ref 26.0–34.0)
MCHC: 33.1 g/dL (ref 30.0–36.0)
MCV: 96.2 fL (ref 78.0–100.0)
MONO ABS: 0.9 10*3/uL (ref 0.1–1.0)
MONOS PCT: 12 %
NEUTROS PCT: 46 %
Neutro Abs: 3.2 10*3/uL (ref 1.7–7.7)
PLATELETS: 239 10*3/uL (ref 150–400)
RBC: 3.9 MIL/uL (ref 3.87–5.11)
RDW: 12.7 % (ref 11.5–15.5)
WBC: 7.2 10*3/uL (ref 4.0–10.5)

## 2016-10-05 MED ORDER — HYDROXYZINE HCL 25 MG PO TABS: 25.0000 mg | ORAL_TABLET | Freq: Three times a day (TID) | ORAL | 0 refills | Status: DC | PRN

## 2016-10-05 MED ORDER — CIPROFLOXACIN HCL 500 MG PO TABS: 500.0000 mg | ORAL_TABLET | Freq: Two times a day (BID) | ORAL | 0 refills | Status: DC

## 2016-10-05 MED ORDER — PERMETHRIN 5 % EX CREA: TOPICAL_CREAM | CUTANEOUS | 1 refills | Status: DC

## 2016-10-05 MED ORDER — SODIUM CHLORIDE 0.9 % IV SOLN
Freq: Once | INTRAVENOUS | Status: AC
  Administered 2016-10-05: 08:00:00 via INTRAVENOUS

## 2016-10-05 MED ORDER — HYDROXYZINE HCL 25 MG PO TABS
25.0000 mg | ORAL_TABLET | Freq: Once | ORAL | Status: AC
  Administered 2016-10-05: 25 mg via ORAL
  Filled 2016-10-05: qty 1

## 2016-10-05 NOTE — Discharge Instructions (Signed)
It was my pleasure taking care of you today!   Stay very well hydrated with plenty of water throughout the day. Please take antibiotic until completion. Follow up with primary care physician in 1 week for recheck of ongoing symptoms.  Use cream as directed. Atarax as needed for itching.   Please seek immediate care if you develop the following: Your symptoms are no better or worse in 3 days. There is severe back pain or lower abdominal pain.  You develop chills.  You have a fever.  There is nausea or vomiting.  There is continued burning or discomfort with urination.  You have any additional concerns.

## 2016-10-05 NOTE — ED Notes (Signed)
Patient transported to Ultrasound 

## 2016-10-05 NOTE — ED Triage Notes (Signed)
Pt states that she is here for itching related to "some type of bug" that is in her apartment. Pt states that sh has generalized itching. Pt states that she also has rt flank pain with a hx of kidney stones.

## 2016-10-05 NOTE — ED Provider Notes (Signed)
[facility]-EMERGENCY DEPT Provider Note   CSN: [id number] Arrival date & time: 10/05/16  0505     History   Chief Complaint Chief Complaint  Patient presents with  . Pruritis  . Flank Pain    HPI Joy Nelson is a 41 y.o. female.  The history is provided by the patient and medical records. No language interpreter was used.  Flank Pain  Pertinent negatives include no abdominal pain.   Joy Nelson is a 41 y.o. female  with a PMH of interstitial cystitis who presents to the Emergency Department with two complaints:  1. Persistent right flank and back pain x 2-3 months associated with dysuria. Patient states that she has a history of passing multiple kidney stones but has never had imaging performed. She denies fever, chills, nausea or vomiting. She has been applying a heating pad with mild relief. No medications taken for symptoms.   2. Pruritis. Patient states that she and roommate have both been itching all over, worse at night, for the last week. She called housing department who sent an exterminator to the house, however itching has persistent. She notes small red bites to arms, hands and feet. Again no fevers.   Past Medical History:  Diagnosis Date  . ADD (attention deficit disorder)   . Anxiety   . Back pain   . Cerebral aneurysm   . Chiari malformation   . Depression   . Fibromyalgia   . GERD (gastroesophageal reflux disease)   . Hypertension   . Interstitial cystitis   . Kidney stones   . Lupus   . Migraine   . Osteoarthritis   . Raynaud disease   . Seasonal allergies   . Sjogren's disease [facility](H[facility])     Patient Active Problem List   Diagnosis Date Noted  . Fibromyalgia 04/24/2016  . History of systemic lupus erythematosus (SLE) 04/24/2016  . Benign essential HTN 04/24/2016  . Multilevel degenerative disc disease 04/24/2016  . Cellulitis of arm, left 04/18/2016  . Abscess 04/18/2016  . Unruptured cerebral aneurysm 12/19/2015  . Migraine with aura and without  status migrainosus, not intractable 12/19/2015    Past Surgical History:  Procedure Laterality Date  . APPENDECTOMY    . BRAIN SURGERY  02/2014   pipeline stent  . CHOLECYSTECTOMY    . I&D EXTREMITY Left 04/22/2016   Procedure: IRRIGATION AND DEBRIDEMENT LEFT UPPER ARM POSSIBLE ABCESS;  Surgeon: Luretha MurphyMatthew Martin, MD;  Location: [facility] ORS;  Service: General;  Laterality: Left;  Wound left open  . TONSILLECTOMY    . TUBAL LIGATION      OB History    No data available       Home Medications    Prior to Admission medications   Medication Sig Start Date End Date Taking? Authorizing Provider  ciprofloxacin (CIPRO) 500 MG tablet Take 1 tablet (500 mg total) by mouth every 12 (twelve) hours. 10/05/16   Ward, Chase PicketJaime Pilcher, PA-C  clindamycin (CLEOCIN) 300 MG capsule Take 1 capsule (300 mg total) by mouth 3 (three) times daily. 04/24/16   Calvert Cantorizwan, Saima, MD  diphenhydrAMINE (BENADRYL) 25 mg capsule Take 25 mg by mouth at bedtime as needed for allergies.    [provider]  gabapentin (NEURONTIN) 100 MG capsule Take 1 capsule (100 mg total) by mouth 3 (three) times daily. 04/24/16   Calvert Cantorizwan, Saima, MD  hydrOXYzine (ATARAX/VISTARIL) 25 MG tablet Take 1 tablet (25 mg total) by mouth every 8 (eight) hours as needed for itching. 10/05/16   Ward, Marijean NiemannJaime  Pilcher, PA-C  ipratropium (ATROVENT) 0.06 % nasal spray Place 2 sprays into both nostrils 4 (four) times daily. Patient taking differently: Place 2 sprays into both nostrils 2 (two) times daily.  11/25/15   Linna Hoff, MD  lisinopril (PRINIVIL,ZESTRIL) 10 MG tablet Take 1 tablet (10 mg total) by mouth daily. 11/28/15   Riki Sheer, PA-C  montelukast (SINGULAIR) 10 MG tablet Take 10 mg by mouth daily.     [provider]  omeprazole (PRILOSEC) 20 MG capsule Take 20 mg by mouth daily. 12/05/15   [provider]  Oxycodone HCl 10 MG TABS Take 1 tablet (10 mg total) by mouth 4 (four) times daily. 04/24/16   Calvert Cantor, MD    permethrin (ELIMITE) 5 % cream Apply to affected area from neck down at night. Sleep in cream and shower the next morning. 10/05/16   Ward, Chase Picket, PA-C  propranolol ER (INDERAL LA) 80 MG 24 hr capsule Take 1 capsule (80 mg total) by mouth daily. 01/21/16   Penumalli, Glenford Bayley, MD    Family History Family History  Problem Relation Age of Onset  . Hypertension Mother   . Stroke Mother   . Migraines Mother   . Hypertension Father   . Migraines Father     Social History Social History  Substance Use Topics  . Smoking status: Current Every Day Smoker    Packs/day: 0.25    Types: Cigarettes  . Smokeless tobacco: Never Used     Comment: 01/21/16 one pack every 2.5 days  . Alcohol use Yes     Comment: Socially - maybe once monthly     Allergies   Methotrexate derivatives; Celecoxib; Morphine and related; Other; Penicillins; Sulfa antibiotics; and Tape   Review of Systems Review of Systems  Gastrointestinal: Negative for abdominal pain, blood in stool, constipation, diarrhea, nausea and vomiting.  Genitourinary: Positive for dysuria and flank pain. Negative for vaginal bleeding and vaginal discharge.  Musculoskeletal: Positive for back pain. Negative for neck pain.  All other systems reviewed and are negative.    Physical Exam Updated Vital Signs BP 113/73   Pulse (!) 56   Temp 98.3 F (36.8 C) (Oral)   Resp 18   Ht 5\' 1"  (1.549 m)   SpO2 100%   Physical Exam  Constitutional: She is oriented to person, place, and time. She appears well-developed and well-nourished. No distress.  HENT:  Head: Normocephalic and atraumatic.  No oral lesions.  Cardiovascular: Normal rate, regular rhythm and normal heart sounds.   No murmur heard. Pulmonary/Chest: Effort normal and breath sounds normal. No respiratory distress.  Abdominal: Soft. Bowel sounds are normal. She exhibits no distension.  Suprapubic and right flank tenderness. No CVA tenderness.  Musculoskeletal: She  exhibits no edema.  Neurological: She is alert and oriented to person, place, and time.  Skin: Skin is warm and dry.  Multiple erythematous lesions all < 2 cm to upper and lower extremities with overlying excoriations. No rash to palms/soles. No surrounding erythema/warmth concerning for infection.  Nursing note and vitals reviewed.    ED Treatments / Results  Labs (all labs ordered are listed, but only abnormal results are displayed) Labs Reviewed  URINALYSIS, ROUTINE W REFLEX MICROSCOPIC - Abnormal; Notable for the following:       Result Value   APPearance HAZY (*)    Hgb urine dipstick LARGE (*)    Protein, ur 30 (*)    Leukocytes, UA LARGE (*)    Bacteria,  UA RARE (*)    Squamous Epithelial / LPF 6-30 (*)    All other components within normal limits  COMPREHENSIVE METABOLIC PANEL - Abnormal; Notable for the following:    Glucose, Bld 111 (*)    Creatinine, Ser 1.10 (*)    Total Protein 6.1 (*)    ALT 13 (*)    All other components within normal limits  URINE CULTURE  CBC WITH DIFFERENTIAL/PLATELET    EKG  EKG Interpretation None       Radiology US Renal  Result Date: 10/05/2016 CLINICAL DATA:  41 year old hypertensive female with right flank pain for 3 months. History of kidney stones. Initial encounter. EXAM: RENAL / URINARY TRACT ULTRASOUND COMPLETE COMPARISON:  None. FINDINGS: Right Kidney: Length: 10.7 cm. Echogenicity within normal limits. No mass or hydronephrosis visualized. Left Kidney: Length: 11.8 cm. Echogenicity within normal limits. No mass or hydronephrosis visualized. Bladder: Appears normal for degree of bladder distention. IMPRESSION: No hydronephrosis. CT detected renal calculi not appreciated on present ultrasound. Electronically Signed   By: Lacy Duverney M.D.   On: 10/05/2016 08:48    Procedures Procedures (including critical care time)  Medications Ordered in ED Medications  0.9 %  sodium chloride infusion ( Intravenous Stopped 10/05/16 0853)   hydrOXYzine (ATARAX/VISTARIL) tablet 25 mg (25 mg Oral Given 10/05/16 1610)     Initial Impression / Assessment and Plan / ED Course  I have reviewed the triage vital signs and the nursing notes.  Pertinent labs & imaging results that were available during my care of the patient were reviewed by me and considered in my medical decision making (see chart for details).    Joy Nelson is a 41 y.o. female who presents to ED for two complaints:  1. Flank pain and dysuria for 2-3 weeks. On exam, patient is afebrile, hemodynamically stable with nonsurgical abdomen and no CVA tenderness. She does have tenderness to the suprapubic area and right flank. UA with large leuks and 6-30 white cells. Urine sent for culture. Renal ultrasound unremarkable. Will treat with Cipro as patient has allergic reaction to sulfa and anaphylactic reaction to penicillins. PCP follow-up strongly encouraged. Patient states with case management for help with PCP follow-up. Greatly appreciate her assistance with care.  2. Pruritis. Roommate with similar. Lesions c/w bites but no overlying signs of infection. Will treat with permethrin and atarax as needed for itching.   Reasons to return to ER discussed and all questions answered.    Final Clinical Impressions(s) / ED Diagnoses   Final diagnoses:  Acute cystitis with hematuria  Pruritus    New Prescriptions New Prescriptions   CIPROFLOXACIN (CIPRO) 500 MG TABLET    Take 1 tablet (500 mg total) by mouth every 12 (twelve) hours.   HYDROXYZINE (ATARAX/VISTARIL) 25 MG TABLET    Take 1 tablet (25 mg total) by mouth every 8 (eight) hours as needed for itching.   PERMETHRIN (ELIMITE) 5 % CREAM    Apply to affected area from neck down at night. Sleep in cream and shower the next morning.     Ward, Chase Picket, PA-C 10/05/16 9604    Lorre Nick, MD 10/08/16 [phone]

## 2016-10-05 NOTE — Discharge Planning (Signed)
EDCM consulted to assist with follow-up appointment.  Pt has Medicaid Insurance with an assigned PCP : [facility]   Address: [address] [place] [address] [place], [place] [id number]   [facility] explained [facility] that she must contact DSS to have PCP changed in order to be seen by another MD.  Pt verbalizes understanding. Ketan Renz J. Lucretia RoersWood, RN, BSN, [place] [phone]

## 2016-10-06 LAB — URINE CULTURE: Culture: <10000 — AB

## 2016-12-22 ENCOUNTER — Other Ambulatory Visit: Payer: Self-pay | Admitting: Diagnostic Neuroimaging

## 2016-12-23 ENCOUNTER — Encounter (HOSPITAL_COMMUNITY): Payer: Self-pay | Admitting: Emergency Medicine

## 2016-12-23 ENCOUNTER — Emergency Department (HOSPITAL_COMMUNITY)
Admission: EM | Admit: 2016-12-23 | Discharge: 2016-12-24 | Disposition: A | Discharge status: Home / Self Care | Payer: Medicaid Other | Attending: Emergency Medicine | Admitting: Emergency Medicine

## 2016-12-23 DIAGNOSIS — F17210 Nicotine dependence, cigarettes, uncomplicated: Secondary | ICD-10-CM | POA: Insufficient documentation

## 2016-12-23 DIAGNOSIS — R102 Pelvic and perineal pain: Secondary | ICD-10-CM | POA: Diagnosis not present

## 2016-12-23 DIAGNOSIS — F909 Attention-deficit hyperactivity disorder, unspecified type: Secondary | ICD-10-CM | POA: Diagnosis not present

## 2016-12-23 DIAGNOSIS — I10 Essential (primary) hypertension: Secondary | ICD-10-CM | POA: Diagnosis not present

## 2016-12-23 NOTE — ED Notes (Signed)
Bed: YQ65 Expected date:  Expected time:  Means of arrival:  Comments: 41 yo F right lower quadrant pain

## 2016-12-23 NOTE — ED Provider Notes (Signed)
[facility]-EMERGENCY DEPT Provider Note: Lowella Dell, MD, FACEP  CSN: [id number] MRN: [id number] ARRIVAL: 12/23/16 at 2247 ROOM: WA19/WA19   CHIEF COMPLAINT  Abdominal Pain   HISTORY OF PRESENT ILLNESS  12/23/16 11:08 PM Joy Nelson is a 41 y.o. female with a one-month history of right lower quadrant abdominal pain. The pain radiates to the right flank. The pain is worsened acutely over the past 2 days. She rates her pain as an 8 out of 10 presently and sharp in nature. Pain is worse with movement and palpation. She has also had an associated sensation of inadequately voiding her bladder; she does have a history of urethral stenosis which she has not had dilated in several years.she states her pain is similar to previous ovarian cyst. She has had intermittent nausea as well.    Past Medical History:  Diagnosis Date  . ADD (attention deficit disorder)   . Anxiety   . Back pain   . Cerebral aneurysm   . Chiari malformation   . Depression   . Fibromyalgia   . GERD (gastroesophageal reflux disease)   . Hypertension   . Interstitial cystitis   . Kidney stones   . Lupus   . Migraine   . Osteoarthritis   . Raynaud disease   . Seasonal allergies   . Sjogren's disease [facility])     Past Surgical History:  Procedure Laterality Date  . APPENDECTOMY    . BRAIN SURGERY  02/2014   pipeline stent  . CHOLECYSTECTOMY    . I&D EXTREMITY Left 04/22/2016   Procedure: IRRIGATION AND DEBRIDEMENT LEFT UPPER ARM POSSIBLE ABCESS;  Surgeon: Luretha Murphy, MD;  Location: [facility] ORS;  Service: General;  Laterality: Left;  Wound left open  . TONSILLECTOMY    . TUBAL LIGATION      Family History  Problem Relation Age of Onset  . Hypertension Mother   . Stroke Mother   . Migraines Mother   . Hypertension Father   . Migraines Father     Social History  Substance Use Topics  . Smoking status: Current Every Day Smoker    Packs/day: 0.25    Types: Cigarettes  . Smokeless tobacco: Never Used   Comment: 01/21/16 one pack every 2.5 days  . Alcohol use Yes     Comment: Socially - maybe once monthly    Prior to Admission medications   Medication Sig Start Date End Date Taking? Authorizing Provider  propranolol ER (INDERAL LA) 80 MG 24 hr capsule Take 1 capsule (80 mg total) by mouth daily. 12/22/16  Yes Penumalli, Glenford Bayley, MD  ciprofloxacin (CIPRO) 500 MG tablet Take 1 tablet (500 mg total) by mouth every 12 (twelve) hours. Patient not taking: Reported on 12/23/2016 10/05/16   Ward, Chase Picket, PA-C  clindamycin (CLEOCIN) 300 MG capsule Take 1 capsule (300 mg total) by mouth 3 (three) times daily. Patient not taking: Reported on 12/23/2016 04/24/16   Calvert Cantor, MD  gabapentin (NEURONTIN) 100 MG capsule Take 1 capsule (100 mg total) by mouth 3 (three) times daily. Patient not taking: Reported on 12/23/2016 04/24/16   Calvert Cantor, MD  hydrOXYzine (ATARAX/VISTARIL) 25 MG tablet Take 1 tablet (25 mg total) by mouth every 8 (eight) hours as needed for itching. Patient not taking: Reported on 12/23/2016 10/05/16   Ward, Chase Picket, PA-C  ipratropium (ATROVENT) 0.06 % nasal spray Place 2 sprays into both nostrils 4 (four) times daily. Patient not taking: Reported on 12/23/2016 11/25/15   Linna Hoff,  MD  lisinopril (PRINIVIL,ZESTRIL) 10 MG tablet Take 1 tablet (10 mg total) by mouth daily. Patient not taking: Reported on 12/23/2016 11/28/15   Riki Sheer, PA-C  Oxycodone HCl 10 MG TABS Take 1 tablet (10 mg total) by mouth 4 (four) times daily. Patient not taking: Reported on 12/23/2016 04/24/16   Calvert Cantor, MD  permethrin (ELIMITE) 5 % cream Apply to affected area from neck down at night. Sleep in cream and shower the next morning. Patient not taking: Reported on 12/23/2016 10/05/16   Ward, Chase Picket, PA-C    Allergies Methotrexate derivatives; Celecoxib; Morphine and related; Other; Penicillins; Sulfa antibiotics; and Tape   REVIEW OF SYSTEMS  Negative except as noted here  or in the History of Present Illness.   PHYSICAL EXAMINATION  Initial Vital Signs Blood pressure (!) 156/96, pulse 71, temperature 98.2 F (36.8 C), temperature source Oral, resp. rate 16, SpO2 100 %.  Examination General: Well-developed, well-nourished female in no acute distress; appearance consistent with age of record HENT: normocephalic; atraumatic Eyes: pupils equal, round and reactive to light; extraocular muscles intact Neck: supple Heart: regular rate and rhythm Lungs: clear to auscultation bilaterally Abdomen: soft; nondistended; right suprapubic tenderness; no masses or hepatosplenomegaly; bowel sounds present GU: Right CVA tenderness Extremities: No deformity; full range of motion; pulses normal Neurologic: Awake, alert and oriented; motor function intact in all extremities and symmetric; no facial droop Skin: Warm and dry Psychiatric: Normal mood and affect   RESULTS  Summary of this visit's results, reviewed by myself:   EKG Interpretation  Date/Time:    Ventricular Rate:    PR Interval:    QRS Duration:   QT Interval:    QTC Calculation:   R Axis:     Text Interpretation:        Laboratory Studies: Results for orders placed or performed during the hospital encounter of 12/23/16 (from the past 24 hour(s))  Urinalysis, Routine w reflex microscopic     Status: Abnormal   Collection Time: 12/23/16 11:24 PM  Result Value Ref Range   Color, Urine STRAW (A) YELLOW   APPearance CLEAR CLEAR   Specific Gravity, Urine 1.015 1.005 - 1.030   pH 6.0 5.0 - 8.0   Glucose, UA NEGATIVE NEGATIVE mg/dL   Hgb urine dipstick NEGATIVE NEGATIVE   Bilirubin Urine NEGATIVE NEGATIVE   Ketones, ur NEGATIVE NEGATIVE mg/dL   Protein, ur NEGATIVE NEGATIVE mg/dL   Nitrite NEGATIVE NEGATIVE   Leukocytes, UA NEGATIVE NEGATIVE  Pregnancy, urine     Status: None   Collection Time: 12/23/16 11:24 PM  Result Value Ref Range   Preg Test, Ur NEGATIVE NEGATIVE   Imaging Studies: US  Pelvis Transvanginal Non-ob (tv Only)  Result Date: 12/24/2016 CLINICAL DATA:  Pelvic pain x2 weeks. History of tubal ligation, urinary tract infection and ovarian cysts. EXAM: TRANSABDOMINAL AND TRANSVAGINAL ULTRASOUND OF PELVIS TECHNIQUE: Both transabdominal and transvaginal ultrasound examinations of the pelvis were performed. Transabdominal technique was performed for global imaging of the pelvis including uterus, ovaries, adnexal regions, and pelvic cul-de-sac. It was necessary to proceed with endovaginal exam following the transabdominal exam to visualize the ovaries. COMPARISON:  None FINDINGS: Uterus Measurements: 7.3 x 3.6 x 4 cm transvaginally. Small left-sided anterior intramural uterine body hypoechoic fibroid measuring 1.1 x 1 x 1 cm. Endometrium Thickness: 1.1 cm.  No focal abnormality visualized. Right ovary Measurements: 3 x 1.9 x 1.6 cm. Normal appearance/no adnexal mass. Color Doppler flow was noted within. Left ovary Measurements: 3.3 x  1.8 x 2.6 cm. Normal appearance/no adnexal mass. Color Doppler flow was noted within. Other findings No abnormal free fluid. IMPRESSION: 1. Small anterior left-sided intramural uterine body fibroid measuring 1.1 x 1 x 1 cm. 2. Homogeneous appearance of the endometrial lining. 3. Unremarkable appearance of the ovaries. Electronically Signed   By: Tollie Eth M.D.   On: 12/24/2016 01:21   US Pelvis Complete  Result Date: 12/24/2016 CLINICAL DATA:  Pelvic pain x2 weeks. History of tubal ligation, urinary tract infection and ovarian cysts. EXAM: TRANSABDOMINAL AND TRANSVAGINAL ULTRASOUND OF PELVIS TECHNIQUE: Both transabdominal and transvaginal ultrasound examinations of the pelvis were performed. Transabdominal technique was performed for global imaging of the pelvis including uterus, ovaries, adnexal regions, and pelvic cul-de-sac. It was necessary to proceed with endovaginal exam following the transabdominal exam to visualize the ovaries. COMPARISON:  None  FINDINGS: Uterus Measurements: 7.3 x 3.6 x 4 cm transvaginally. Small left-sided anterior intramural uterine body hypoechoic fibroid measuring 1.1 x 1 x 1 cm. Endometrium Thickness: 1.1 cm.  No focal abnormality visualized. Right ovary Measurements: 3 x 1.9 x 1.6 cm. Normal appearance/no adnexal mass. Color Doppler flow was noted within. Left ovary Measurements: 3.3 x 1.8 x 2.6 cm. Normal appearance/no adnexal mass. Color Doppler flow was noted within. Other findings No abnormal free fluid. IMPRESSION: 1. Small anterior left-sided intramural uterine body fibroid measuring 1.1 x 1 x 1 cm. 2. Homogeneous appearance of the endometrial lining. 3. Unremarkable appearance of the ovaries. Electronically Signed   By: Tollie Eth M.D.   On: 12/24/2016 01:21   Ct Renal Stone Study  Result Date: 12/24/2016 CLINICAL DATA:  Right flank and right lower quadrant pain. EXAM: CT ABDOMEN AND PELVIS WITHOUT CONTRAST TECHNIQUE: Multidetector CT imaging of the abdomen and pelvis was performed following the standard protocol without IV contrast. COMPARISON:  CT 10/18/2015 FINDINGS: Lower chest: The lung bases are clear. Hepatobiliary: No focal liver abnormality is seen. Status post cholecystectomy. No biliary dilatation. Pancreas: No ductal dilatation or inflammation. Spleen: Normal in size without focal abnormality. Adrenals/Urinary Tract: Normal adrenal glands. Small nonobstructing stones in both kidneys without hydronephrosis or perinephric edema. Both ureters are decompressed without stones along the course. Urinary bladder is minimally distended, no bladder wall thickening. Stomach/Bowel: Ingested material in the stomach. No bowel wall thickening, inflammation or obstruction. Right lower quadrant surgical clips, post appendectomy. Moderate colonic stool burden without colonic wall thickening or inflammation. Minimal descending and sigmoid diverticulosis. No diverticulitis. Vascular/Lymphatic: Normal caliber abdominal aorta.  No  adenopathy. Reproductive: Uterus and bilateral adnexa are unremarkable. Other: No free air, free fluid, or intra-abdominal fluid collection. Musculoskeletal: There are no acute or suspicious osseous abnormalities. IMPRESSION: 1. Nonobstructing stones in both kidneys. No hydronephrosis or obstructive uropathy. 2. No acute abnormality in the abdomen/pelvis. 3. Minimal colonic diverticulosis without diverticulitis. Electronically Signed   By: Rubye Oaks M.D.   On: 12/24/2016 03:08    ED COURSE  Nursing notes and initial vitals signs, including pulse oximetry, reviewed.  Vitals:   12/23/16 2306 12/24/16 0119  BP: (!) 156/96 (!) 155/76  Pulse: 71 61  Resp: 16 18  Temp: 98.2 F (36.8 C)   TempSrc: Oral   SpO2: 100% 100%   4:21 AM Patient advised of reassuring diagnostic studies. She was advised that the cause of her pain was not immediately obvious. Should symptoms worsen or change she should return to the ED.  PROCEDURES    ED DIAGNOSES     ICD-10-CM   1. Pelvic pain in  female R10.2        Paula Libra, MD 12/24/16 [phone]

## 2016-12-23 NOTE — ED Triage Notes (Signed)
Per [facility] pt c/o RLQ abdominal pain radiating into right flank area. Pt has hx of ovarian cysts and UTI. Pt c/o pain 8/10 constant and sharp with similar pain r/t ovarian cyst rupture. Pt c/o difficulty urinating. Pt alert and ambulatory.

## 2016-12-24 ENCOUNTER — Emergency Department (HOSPITAL_COMMUNITY): Payer: Medicaid Other

## 2016-12-24 LAB — URINALYSIS, ROUTINE W REFLEX MICROSCOPIC
BILIRUBIN URINE: NEGATIVE
GLUCOSE, UA: NEGATIVE mg/dL
Hgb urine dipstick: NEGATIVE
Ketones, ur: NEGATIVE mg/dL
LEUKOCYTES UA: NEGATIVE
NITRITE: NEGATIVE
PH: 6 (ref 5.0–8.0)
PROTEIN: NEGATIVE mg/dL
Specific Gravity, Urine: 1.015 (ref 1.005–1.030)

## 2016-12-24 LAB — PREGNANCY, URINE: Preg Test, Ur: NEGATIVE

## 2016-12-24 NOTE — ED Notes (Signed)
Patient is alert and oriented x3.  She was given DC instructions and follow up visit instructions.  Patient gave verbal understanding. She was DC ambulatory under her own power to home.  V/S stable.  He was not showing any signs of distress on DC 

## 2016-12-24 NOTE — ED Notes (Signed)
Patient transported to CT 

## 2017-02-08 ENCOUNTER — Emergency Department (HOSPITAL_COMMUNITY): Payer: Medicaid Other

## 2017-02-08 ENCOUNTER — Encounter (HOSPITAL_COMMUNITY): Payer: Self-pay | Admitting: Emergency Medicine

## 2017-02-08 ENCOUNTER — Emergency Department (HOSPITAL_COMMUNITY)
Admission: EM | Admit: 2017-02-08 | Discharge: 2017-02-08 | Disposition: A | Discharge status: Home / Self Care | Payer: Medicaid Other | Attending: Emergency Medicine | Admitting: Emergency Medicine

## 2017-02-08 DIAGNOSIS — R10819 Abdominal tenderness, unspecified site: Secondary | ICD-10-CM | POA: Insufficient documentation

## 2017-02-08 DIAGNOSIS — F17210 Nicotine dependence, cigarettes, uncomplicated: Secondary | ICD-10-CM | POA: Insufficient documentation

## 2017-02-08 DIAGNOSIS — M545 Low back pain, unspecified: Secondary | ICD-10-CM

## 2017-02-08 DIAGNOSIS — F909 Attention-deficit hyperactivity disorder, unspecified type: Secondary | ICD-10-CM | POA: Insufficient documentation

## 2017-02-08 DIAGNOSIS — Z7982 Long term (current) use of aspirin: Secondary | ICD-10-CM | POA: Diagnosis not present

## 2017-02-08 DIAGNOSIS — I10 Essential (primary) hypertension: Secondary | ICD-10-CM | POA: Insufficient documentation

## 2017-02-08 LAB — CBC WITH DIFFERENTIAL/PLATELET
Basophils Absolute: 0 10*3/uL (ref 0.0–0.1)
Basophils Relative: 0 %
Eosinophils Absolute: 0.1 10*3/uL (ref 0.0–0.7)
Eosinophils Relative: 2 %
HCT: 42.5 % (ref 36.0–46.0)
Hemoglobin: 14.1 g/dL (ref 12.0–15.0)
Lymphocytes Relative: 36 %
Lymphs Abs: 2 10*3/uL (ref 0.7–4.0)
MCH: 32.4 pg (ref 26.0–34.0)
MCHC: 33.2 g/dL (ref 30.0–36.0)
MCV: 97.7 fL (ref 78.0–100.0)
Monocytes Absolute: 0.5 10*3/uL (ref 0.1–1.0)
Monocytes Relative: 8 %
Neutro Abs: 3.1 10*3/uL (ref 1.7–7.7)
Neutrophils Relative %: 54 %
Platelets: 132 10*3/uL — ABNORMAL LOW (ref 150–400)
RBC: 4.35 MIL/uL (ref 3.87–5.11)
RDW: 13 % (ref 11.5–15.5)
WBC: 5.7 10*3/uL (ref 4.0–10.5)

## 2017-02-08 LAB — COMPREHENSIVE METABOLIC PANEL
ALT: 13 U/L — ABNORMAL LOW (ref 14–54)
AST: 22 U/L (ref 15–41)
Albumin: 4.6 g/dL (ref 3.5–5.0)
Alkaline Phosphatase: 58 U/L (ref 38–126)
Anion gap: 9 (ref 5–15)
BUN: 16 mg/dL (ref 6–20)
CO2: 23 mmol/L (ref 22–32)
Calcium: 9.7 mg/dL (ref 8.9–10.3)
Chloride: 105 mmol/L (ref 101–111)
Creatinine, Ser: 0.7 mg/dL (ref 0.44–1.00)
GFR calc Af Amer: >60 mL/min (ref >60)
GFR calc non Af Amer: >60 mL/min (ref >60)
Glucose, Bld: 100 mg/dL — ABNORMAL HIGH (ref 65–99)
Potassium: 4.2 mmol/L (ref 3.5–5.1)
Sodium: 137 mmol/L (ref 135–145)
Total Bilirubin: 0.3 mg/dL (ref 0.3–1.2)
Total Protein: 7.6 g/dL (ref 6.5–8.1)

## 2017-02-08 LAB — POC URINE PREG, ED: Preg Test, Ur: NEGATIVE

## 2017-02-08 LAB — URINALYSIS, ROUTINE W REFLEX MICROSCOPIC
Bilirubin Urine: NEGATIVE
GLUCOSE, UA: NEGATIVE mg/dL
Hgb urine dipstick: NEGATIVE
Ketones, ur: NEGATIVE mg/dL
LEUKOCYTES UA: NEGATIVE
NITRITE: NEGATIVE
PH: 5 (ref 5.0–8.0)
PROTEIN: NEGATIVE mg/dL
Specific Gravity, Urine: 1.014 (ref 1.005–1.030)

## 2017-02-08 MED ORDER — ONDANSETRON 4 MG PO TBDP
4.0000 mg | ORAL_TABLET | Freq: Once | ORAL | Status: AC
  Administered 2017-02-08: 4 mg via ORAL
  Filled 2017-02-08: qty 1

## 2017-02-08 MED ORDER — ONDANSETRON HCL 4 MG/2ML IJ SOLN: 4.0000 mg | Freq: Once | INTRAMUSCULAR | Status: DC

## 2017-02-08 MED ORDER — HYDROMORPHONE HCL 1 MG/ML IJ SOLN
0.5000 mg | Freq: Once | INTRAMUSCULAR | Status: AC
  Administered 2017-02-08: 0.5 mg via INTRAVENOUS
  Filled 2017-02-08: qty 1

## 2017-02-08 MED ORDER — ONDANSETRON HCL 4 MG/2ML IJ SOLN
4.0000 mg | Freq: Once | INTRAMUSCULAR | Status: AC
  Administered 2017-02-08: 4 mg via INTRAVENOUS
  Filled 2017-02-08: qty 2

## 2017-02-08 MED ORDER — TRAMADOL HCL 50 MG PO TABS: 50.0000 mg | ORAL_TABLET | Freq: Four times a day (QID) | ORAL | 0 refills | Status: DC | PRN

## 2017-02-08 NOTE — ED Provider Notes (Signed)
[facility]-EMERGENCY DEPT Provider Note   CSN: [id number] Arrival date & time: 02/08/17  1449     History   Chief Complaint Chief Complaint  Patient presents with  . Flank Pain    HPI Joy Nelson is a 41 y.o. female.  Patient complains of lower back pain.  Patient has a history of kidney stones   The history is provided by the patient.  Flank Pain  This is a recurrent problem. The current episode started more than 1 week ago. The problem occurs constantly. The problem has not changed since onset.Pertinent negatives include no chest pain, no abdominal pain and no headaches. Nothing aggravates the symptoms. Nothing relieves the symptoms. She has tried nothing for the symptoms. The treatment provided mild relief.    Past Medical History:  Diagnosis Date  . ADD (attention deficit disorder)   . Anxiety   . Back pain   . Cerebral aneurysm   . Chiari malformation   . Depression   . Fibromyalgia   . GERD (gastroesophageal reflux disease)   . Hypertension   . Interstitial cystitis   . Kidney stones   . Lupus   . Migraine   . Osteoarthritis   . Raynaud disease   . Seasonal allergies   . Sjogren's disease [facility])     Patient Active Problem List   Diagnosis Date Noted  . Fibromyalgia 04/24/2016  . History of systemic lupus erythematosus (SLE) 04/24/2016  . Benign essential HTN 04/24/2016  . Multilevel degenerative disc disease 04/24/2016  . Cellulitis of arm, left 04/18/2016  . Abscess 04/18/2016  . Unruptured cerebral aneurysm 12/19/2015  . Migraine with aura and without status migrainosus, not intractable 12/19/2015    Past Surgical History:  Procedure Laterality Date  . APPENDECTOMY    . BRAIN SURGERY  02/2014   pipeline stent  . CHOLECYSTECTOMY    . TONSILLECTOMY    . TUBAL LIGATION      OB History    No data available       Home Medications    Prior to Admission medications   Medication Sig Start Date End Date Taking?  Authorizing Provider  aspirin EC 81 MG tablet Take 81 mg daily by mouth.   Yes [provider]  propranolol ER (INDERAL LA) 80 MG 24 hr capsule TAKE 1 CAPSULE (80 MG TOTAL) BY MOUTH DAILY. 02/01/17  Yes [provider]  traMADol (ULTRAM) 50 MG tablet Take 1 tablet (50 mg total) every 6 (six) hours as needed by mouth. 02/08/17   Bethann BerkshireZammit, Brailey Buescher, MD    Family History Family History  Problem Relation Age of Onset  . Hypertension Mother   . Stroke Mother   . Migraines Mother   . Hypertension Father   . Migraines Father     Social History Social History   Tobacco Use  . Smoking status: Current Every Day Smoker    Packs/day: 0.25    Types: Cigarettes  . Smokeless tobacco: Never Used  . Tobacco comment: 01/21/16 one pack every 2.5 days  Substance Use Topics  . Alcohol use: Yes    Comment: Socially - maybe once monthly  . Drug use: No     Allergies   Methotrexate derivatives; Celecoxib; Morphine and related; Other; Penicillins; Sulfa antibiotics; and Tape   Review of Systems Review of Systems  Constitutional: Negative for appetite change and fatigue.  HENT: Negative for congestion, ear discharge and sinus pressure.   Eyes: Negative for discharge.  Respiratory: Negative for  cough.   Cardiovascular: Negative for chest pain.  Gastrointestinal: Negative for abdominal pain and diarrhea.  Genitourinary: Positive for flank pain. Negative for frequency and hematuria.  Musculoskeletal: Negative for back pain.  Skin: Negative for rash.  Neurological: Negative for seizures and headaches.  Psychiatric/Behavioral: Negative for hallucinations.     Physical Exam Updated Vital Signs BP 138/83 (BP Location: Left Arm)   Pulse 62   Temp 98.8 F (37.1 C) (Oral)   Resp 16   LMP 01/20/2017   SpO2 100%   Physical Exam  Constitutional: She is oriented to person, place, and time. She appears well-developed.  HENT:  Head: Normocephalic.  Eyes: Conjunctivae and EOM are  normal. No scleral icterus.  Neck: Neck supple. No thyromegaly present.  Cardiovascular: Normal rate and regular rhythm. Exam reveals no gallop and no friction rub.  No murmur heard. Pulmonary/Chest: No stridor. She has no wheezes. She has no rales. She exhibits no tenderness.  Abdominal: She exhibits no distension. There is no tenderness. There is no rebound.  Musculoskeletal: Normal range of motion. She exhibits no edema.  Tenderness bilateral flank  Lymphadenopathy:    She has no cervical adenopathy.  Neurological: She is oriented to person, place, and time. She exhibits normal muscle tone. Coordination normal.  Skin: No rash noted. No erythema.  Psychiatric: She has a normal mood and affect. Her behavior is normal.     ED Treatments / Results  Labs (all labs ordered are listed, but only abnormal results are displayed) Labs Reviewed  URINALYSIS, ROUTINE W REFLEX MICROSCOPIC - Abnormal; Notable for the following components:      Result Value   APPearance HAZY (*)    All other components within normal limits  CBC WITH DIFFERENTIAL/PLATELET - Abnormal; Notable for the following components:   Platelets 132 (*)    All other components within normal limits  COMPREHENSIVE METABOLIC PANEL - Abnormal; Notable for the following components:   Glucose, Bld 100 (*)    ALT 13 (*)    All other components within normal limits  POC URINE PREG, ED    EKG  EKG Interpretation None       Radiology Ct Renal Stone Study  Result Date: 02/08/2017 CLINICAL DATA:  Bilateral flank pain with dysuria, 1 week duration. EXAM: CT ABDOMEN AND PELVIS WITHOUT CONTRAST TECHNIQUE: Multidetector CT imaging of the abdomen and pelvis was performed following the standard protocol without IV contrast. COMPARISON:  12/24/2016.  10/18/2015. FINDINGS: Lower chest: Normal Hepatobiliary: Previous cholecystectomy. Liver parenchyma appears normal. Pancreas: Normal Spleen: Normal Adrenals/Urinary Tract: Adrenal glands  are normal. Right kidney contains a few tiny nonobstructing stones. No evidence of hydronephrosis or passing stone. The left kidney contains several small stones, the largest in the upper pole measuring 3 mm. No evidence of passing stone or hydronephrosis. No stone in the bladder or urethra. Stomach/Bowel: Normal.  Previous appendectomy. Vascular/Lymphatic: Normal Reproductive: Uterus and adnexal regions appear normal. No pelvic mass. Other: No free fluid or air. Musculoskeletal: Mild lower lumbar degenerative changes. IMPRESSION: No change. No acute finding by CT. Small nonobstructing stones in each kidney. No hydronephrosis or passing stone. Stone pattern appears the same as on the previous exam. Electronically Signed   By: Paulina FusiMark  Shogry M.D.   On: 02/08/2017 18:14    Procedures Procedures (including critical care time)  Medications Ordered in ED Medications  ondansetron (ZOFRAN-ODT) disintegrating tablet 4 mg (4 mg Oral Given 02/08/17 1505)  HYDROmorphone (DILAUDID) injection 0.5 mg (0.5 mg Intravenous Given 02/08/17 1725)  ondansetron (ZOFRAN) injection 4 mg (4 mg Intravenous Given 02/08/17 1725)     Initial Impression / Assessment and Plan / ED Course  I have reviewed the triage vital signs and the nursing notes.  Pertinent labs & imaging results that were available during my care of the patient were reviewed by me and considered in my medical decision making (see chart for details).    Labs unremarkable.  CT shows kidney stones in her kidney.  She also has degenerative disc disease.  Patient will be given some Ultram and will follow-up with urology and a family doctor  Final Clinical Impressions(s) / ED Diagnoses   Final diagnoses:  Back pain at L4-L5 level    ED Discharge Orders        Ordered    traMADol (ULTRAM) 50 MG tablet  Every 6 hours PRN     02/08/17 1856       Bethann Berkshire, MD 02/08/17 1859

## 2017-02-08 NOTE — Discharge Instructions (Signed)
Follow-up with Dr. Liliane ShiWinter for urology as needed

## 2017-02-08 NOTE — ED Triage Notes (Signed)
Per [facility] patient from home c/o bilat flank pain x week with dysuria and feeling like retaining fluid with little nausea. Patient has PMH kidney stones.  Vitals: 184/118, 84HR, 18R.

## 2017-02-08 NOTE — ED Notes (Signed)
Pt ambulated to the restroom with no assistance an dis aware that a urine sample is needed and is attempting to get one at this time.

## 2017-05-31 ENCOUNTER — Encounter (HOSPITAL_COMMUNITY): Payer: Self-pay | Admitting: Emergency Medicine

## 2017-05-31 ENCOUNTER — Ambulatory Visit (HOSPITAL_COMMUNITY)
Admission: EM | Admit: 2017-05-31 | Discharge: 2017-05-31 | Disposition: A | Discharge status: Home / Self Care | Payer: Medicaid Other | Attending: Family Medicine | Admitting: Family Medicine

## 2017-05-31 DIAGNOSIS — J209 Acute bronchitis, unspecified: Secondary | ICD-10-CM | POA: Diagnosis not present

## 2017-05-31 MED ORDER — AZITHROMYCIN 250 MG PO TABS: 250.0000 mg | ORAL_TABLET | Freq: Every day | ORAL | 0 refills | Status: DC

## 2017-05-31 MED ORDER — SPACER/AERO CHAMBER MOUTHPIECE MISC: 1.0000 | Freq: Once | 0 refills | Status: AC

## 2017-05-31 MED ORDER — HYDROCODONE-HOMATROPINE 5-1.5 MG/5ML PO SYRP: 5.0000 mL | ORAL_SOLUTION | Freq: Four times a day (QID) | ORAL | 0 refills | Status: AC | PRN

## 2017-05-31 MED ORDER — PREDNISONE 50 MG PO TABS: 50.0000 mg | ORAL_TABLET | Freq: Every day | ORAL | 0 refills | Status: AC

## 2017-05-31 MED ORDER — ALBUTEROL SULFATE HFA 108 (90 BASE) MCG/ACT IN AERS: 1.0000 | INHALATION_SPRAY | Freq: Four times a day (QID) | RESPIRATORY_TRACT | 0 refills | Status: DC | PRN

## 2017-05-31 NOTE — Discharge Instructions (Signed)
Please begin azithromycin- 2 tablets today and 1 tablet for the following 4 days.   Please begin prednisone 50 mg for the next 5 days.  For congestion you may use a daily allergy pill like Zyrtec or Claritin, or store brand.  You may also try Flonase nasal spray daily.  For cough please use Delsym or Robitussin during the day.  You may use Hycodan at night, this will cause sedation please only use at bedtime or when you will be home.  Please do not drive after use.  Please use inhaler every 6 hours as needed for shortness of breath, wheezing.

## 2017-05-31 NOTE — ED Provider Notes (Signed)
[facility]    CSN: [id number] Arrival date & time: 05/31/17  1412     History   Chief Complaint Chief Complaint  Patient presents with  . Cough    HPI Joy Nelson is a 42 y.o. female history of hypertension presenting today with cough and congestion for 2 months.  She has taken Tylenol as she has had a fever up to 102.  She comes in today because her symptoms have continued to persist and she has started to feel rattling in her chest.  Patient is having some headache and chest discomfort with coughing.  States she is producing a thick green mucus.  Denies nausea, vomiting, abdominal pain.  Patient is a smoker, smokes approximately 3 cigarettes a day states she is trying to cut back.  Has been smoking for a few years.  HPI  Past Medical History:  Diagnosis Date  . ADD (attention deficit disorder)   . Anxiety   . Back pain   . Cerebral aneurysm   . Chiari malformation   . Depression   . Fibromyalgia   . GERD (gastroesophageal reflux disease)   . Hypertension   . Interstitial cystitis   . Kidney stones   . Lupus   . Migraine   . Osteoarthritis   . Raynaud disease   . Seasonal allergies   . Sjogren's disease [facility])     Patient Active Problem List   Diagnosis Date Noted  . Fibromyalgia 04/24/2016  . History of systemic lupus erythematosus (SLE) 04/24/2016  . Benign essential HTN 04/24/2016  . Multilevel degenerative disc disease 04/24/2016  . Cellulitis of arm, left 04/18/2016  . Abscess 04/18/2016  . Unruptured cerebral aneurysm 12/19/2015  . Migraine with aura and without status migrainosus, not intractable 12/19/2015    Past Surgical History:  Procedure Laterality Date  . APPENDECTOMY    . BRAIN SURGERY  02/2014   pipeline stent  . CHOLECYSTECTOMY    . I&D EXTREMITY Left 04/22/2016   Procedure: IRRIGATION AND DEBRIDEMENT LEFT UPPER ARM POSSIBLE ABCESS;  Surgeon: Luretha Murphy, MD;  Location: [facility];  Service: General;  Laterality: Left;  Wound  left open  . TONSILLECTOMY    . TUBAL LIGATION      OB History    No data available       Home Medications    Prior to Admission medications   Medication Sig Start Date End Date Taking? Authorizing Provider  albuterol (PROVENTIL HFA;VENTOLIN HFA) 108 (90 Base) MCG/ACT inhaler Inhale 1-2 puffs into the lungs every 6 (six) hours as needed for wheezing or shortness of breath. 05/31/17   Alioune Hodgkin C, PA-C  aspirin EC 81 MG tablet Take 81 mg daily by mouth.    [provider]  azithromycin (ZITHROMAX) 250 MG tablet Take 1 tablet (250 mg total) by mouth daily. Take first 2 tablets together, then 1 every day until finished. 05/31/17   Zakir Henner C, PA-C  HYDROcodone-homatropine (HYCODAN) 5-1.5 MG/5ML syrup Take 5 mLs by mouth every 6 (six) hours as needed for up to 5 days for cough. 05/31/17 06/05/17  Tommie Dejoseph C, PA-C  predniSONE (DELTASONE) 50 MG tablet Take 1 tablet (50 mg total) by mouth daily for 5 days. 05/31/17 06/05/17  Therasa Lorenzi C, PA-C  propranolol ER (INDERAL LA) 80 MG 24 hr capsule TAKE 1 CAPSULE (80 MG TOTAL) BY MOUTH DAILY. 02/01/17   [provider]  Spacer/Aero Chamber Mouthpiece MISC 1 each by Does not apply route once for 1  dose. 05/31/17 05/31/17  Lionel Woodberry C, PA-C  traMADol (ULTRAM) 50 MG tablet Take 1 tablet (50 mg total) every 6 (six) hours as needed by mouth. 02/08/17   Bethann BerkshireZammit, Joseph, MD    Family History Family History  Problem Relation Age of Onset  . Hypertension Mother   . Stroke Mother   . Migraines Mother   . Hypertension Father   . Migraines Father     Social History Social History   Tobacco Use  . Smoking status: Current Every Day Smoker    Packs/day: 0.25    Types: Cigarettes  . Smokeless tobacco: Never Used  . Tobacco comment: 01/21/16 one pack every 2.5 days  Substance Use Topics  . Alcohol use: Yes    Comment: Socially - maybe once monthly  . Drug use: No     Allergies   Methotrexate derivatives;  Celecoxib; Morphine and related; Other; Penicillins; Sulfa antibiotics; and Tape   Review of Systems Review of Systems  Constitutional: Positive for fever. Negative for chills and fatigue.  HENT: Positive for congestion and rhinorrhea. Negative for ear pain, sinus pressure, sore throat and trouble swallowing.   Respiratory: Positive for cough and wheezing. Negative for chest tightness and shortness of breath.   Cardiovascular: Negative for chest pain.  Gastrointestinal: Negative for abdominal pain, nausea and vomiting.  Musculoskeletal: Negative for myalgias.  Skin: Negative for rash.  Neurological: Negative for dizziness, light-headedness and headaches.     Physical Exam Triage Vital Signs ED Triage Vitals [05/31/17 1431]  Enc Vitals Group     BP (!) 182/91     Pulse Rate 97     Resp 18     Temp 98.9 F (37.2 C)     Temp Source Oral     SpO2 100 %     Weight      Height      Head Circumference      Peak Flow      Pain Score 5     Pain Loc      Pain Edu?      Excl. in GC?    No data found.  Updated Vital Signs BP (!) 182/91 (BP Location: Left Arm)   Pulse 97   Temp 98.9 F (37.2 C) (Oral)   Resp 18   SpO2 100%   Visual Acuity Right Eye Distance:   Left Eye Distance:   Bilateral Distance:    Right Eye Near:   Left Eye Near:    Bilateral Near:     Physical Exam  Constitutional: She appears well-developed and well-nourished. No distress.  HENT:  Head: Normocephalic and atraumatic.  Bilateral TMs not erythematous, nasal mucosa erythematous with rhinorrhea present, posterior oropharynx minimally erythematous, no tonsillar enlargement or exudate.  Eyes: Conjunctivae are normal.  Neck: Neck supple.  Cardiovascular: Normal rate and regular rhythm.  No murmur heard. Pulmonary/Chest: Effort normal. No respiratory distress. She has wheezes.  Patient breathing comfortably at rest, faint wheezing bilaterally  Abdominal: Soft. There is no tenderness.    Musculoskeletal: She exhibits no edema.  Neurological: She is alert.  Skin: Skin is warm and dry.  Psychiatric: She has a normal mood and affect.  Nursing note and vitals reviewed.    UC Treatments / Results  Labs (all labs ordered are listed, but only abnormal results are displayed) Labs Reviewed - No data to display  EKG  EKG Interpretation None       Radiology No results found.  Procedures Procedures (including critical  care time)  Medications Ordered in UC Medications - No data to display   Initial Impression / Assessment and Plan / UC Course  I have reviewed the triage vital signs and the nursing notes.  Pertinent labs & imaging results that were available during my care of the patient were reviewed by me and considered in my medical decision making (see chart for details).     Patient with sinusitis versus bronchitis.  Given length of symptoms we will treat with azithromycin, patient allergic to penicillin.  Will provide albuterol inhaler, prednisone.  Patient requesting something for cough that is worse at night and waking her up, will provide Hycodan.  Recommended over-the-counter measurements for congestion.  Blood pressure elevated today, patient does not have PCP.  Discussed efforts to continue to establish care.  Monitor blood pressure at home.  Discussed strict return precautions. Patient verbalized understanding and is agreeable with plan.   Final Clinical Impressions(s) / UC Diagnoses   Final diagnoses:  Acute bronchitis, unspecified organism    ED Discharge Orders        Ordered    azithromycin (ZITHROMAX) 250 MG tablet  Daily     05/31/17 1503    HYDROcodone-homatropine (HYCODAN) 5-1.5 MG/5ML syrup  Every 6 hours PRN     05/31/17 1503    predniSONE (DELTASONE) 50 MG tablet  Daily     05/31/17 1503    albuterol (PROVENTIL HFA;VENTOLIN HFA) 108 (90 Base) MCG/ACT inhaler  Every 6 hours PRN     05/31/17 1503    Spacer/Aero Chamber Mouthpiece  MISC   Once     05/31/17 1503       Controlled Substance Prescriptions [place] Controlled Substance Registry consulted? No   Lew Dawes, [place] 05/31/17 1511

## 2017-05-31 NOTE — ED Triage Notes (Signed)
Pt here for cough and URI sx x 2 months

## 2017-06-14 ENCOUNTER — Encounter (HOSPITAL_COMMUNITY): Payer: Self-pay

## 2017-06-14 ENCOUNTER — Other Ambulatory Visit: Payer: Self-pay

## 2017-06-14 ENCOUNTER — Emergency Department (HOSPITAL_COMMUNITY): Payer: Medicaid Other

## 2017-06-14 ENCOUNTER — Emergency Department (HOSPITAL_COMMUNITY)
Admission: EM | Admit: 2017-06-14 | Discharge: 2017-06-14 | Disposition: A | Discharge status: Home / Self Care | Payer: Medicaid Other | Attending: Emergency Medicine | Admitting: Emergency Medicine

## 2017-06-14 DIAGNOSIS — I10 Essential (primary) hypertension: Secondary | ICD-10-CM | POA: Insufficient documentation

## 2017-06-14 DIAGNOSIS — Z7982 Long term (current) use of aspirin: Secondary | ICD-10-CM | POA: Diagnosis not present

## 2017-06-14 DIAGNOSIS — R0789 Other chest pain: Secondary | ICD-10-CM | POA: Diagnosis present

## 2017-06-14 DIAGNOSIS — F17210 Nicotine dependence, cigarettes, uncomplicated: Secondary | ICD-10-CM | POA: Diagnosis not present

## 2017-06-14 DIAGNOSIS — M3210 Systemic lupus erythematosus, organ or system involvement unspecified: Secondary | ICD-10-CM | POA: Insufficient documentation

## 2017-06-14 DIAGNOSIS — Z79899 Other long term (current) drug therapy: Secondary | ICD-10-CM | POA: Insufficient documentation

## 2017-06-14 DIAGNOSIS — R079 Chest pain, unspecified: Secondary | ICD-10-CM

## 2017-06-14 HISTORY — DX: Hypoglycemia, unspecified: E16.2

## 2017-06-14 LAB — BASIC METABOLIC PANEL
ANION GAP: 11 (ref 5–15)
BUN: 16 mg/dL (ref 6–20)
CALCIUM: 9.3 mg/dL (ref 8.9–10.3)
CO2: 24 mmol/L (ref 22–32)
Chloride: 101 mmol/L (ref 101–111)
Creatinine, Ser: 0.83 mg/dL (ref 0.44–1.00)
GFR calc Af Amer: >60 mL/min (ref >60)
GLUCOSE: 94 mg/dL (ref 65–99)
Potassium: 5 mmol/L (ref 3.5–5.1)
Sodium: 136 mmol/L (ref 135–145)

## 2017-06-14 LAB — CBC
HCT: 43.8 % (ref 36.0–46.0)
Hemoglobin: 14.8 g/dL (ref 12.0–15.0)
MCH: 33.3 pg (ref 26.0–34.0)
MCHC: 33.8 g/dL (ref 30.0–36.0)
MCV: 98.4 fL (ref 78.0–100.0)
PLATELETS: 260 10*3/uL (ref 150–400)
RBC: 4.45 MIL/uL (ref 3.87–5.11)
RDW: 12.8 % (ref 11.5–15.5)
WBC: 8.7 10*3/uL (ref 4.0–10.5)

## 2017-06-14 LAB — I-STAT BETA HCG BLOOD, ED (MC, WL, AP ONLY)

## 2017-06-14 LAB — D-DIMER, QUANTITATIVE (NOT AT ARMC): D DIMER QUANT: 0.34 ug{FEU}/mL (ref 0.00–0.50)

## 2017-06-14 NOTE — ED Notes (Addendum)
ED Provider at bedside. CAMPOS 

## 2017-06-14 NOTE — ED Provider Notes (Signed)
[facility]EMERGENCY DEPT Provider Note   CSN: [id number] Arrival date & time: 06/14/17  [id number]     History   Chief Complaint Chief Complaint  Patient presents with  . Shortness of Breath  . Headache    HPI Joy Nelson is a 42 y.o. female.  HPI 42 yo female who reports sharp left sided chest pain x 5 weeks. Worse with breathing. Seen at urgent care recently and placed on azithromycin, albuterol, and hycodan syrup for cough. No improvement. Pain is moderate. No relief. No hx of DVT or PE. No other complaints.    Past Medical History:  Diagnosis Date  . ADD (attention deficit disorder)   . Anxiety   . Back pain   . Cerebral aneurysm   . Chiari malformation   . Depression   . Fibromyalgia   . GERD (gastroesophageal reflux disease)   . Hypertension   . Hypoglycemia   . Interstitial cystitis   . Kidney stones   . Lupus   . Lupus   . Migraine   . Osteoarthritis   . Raynaud disease   . Seasonal allergies   . Sjogren's disease [facility](HCC)     Patient Active Problem List   Diagnosis Date Noted  . Fibromyalgia 04/24/2016  . History of systemic lupus erythematosus (SLE) 04/24/2016  . Benign essential HTN 04/24/2016  . Multilevel degenerative disc disease 04/24/2016  . Cellulitis of arm, left 04/18/2016  . Abscess 04/18/2016  . Unruptured cerebral aneurysm 12/19/2015  . Migraine with aura and without status migrainosus, not intractable 12/19/2015    Past Surgical History:  Procedure Laterality Date  . APPENDECTOMY    . BRAIN SURGERY  02/2014   pipeline stent  . CHOLECYSTECTOMY    . I&D EXTREMITY Left 04/22/2016   Procedure: IRRIGATION AND DEBRIDEMENT LEFT UPPER ARM POSSIBLE ABCESS;  Surgeon: Luretha MurphyMatthew Martin, MD;  Location: [facility];  Service: General;  Laterality: Left;  Wound left open  . TONSILLECTOMY    . TUBAL LIGATION      OB History    No data available       Home Medications    Prior to Admission medications   Medication Sig Start  Date End Date Taking? Authorizing Provider  albuterol (PROVENTIL HFA;VENTOLIN HFA) 108 (90 Base) MCG/ACT inhaler Inhale 1-2 puffs into the lungs every 6 (six) hours as needed for wheezing or shortness of breath. 05/31/17  Yes Wieters, Hallie C, PA-C  aspirin EC 81 MG tablet Take 81 mg daily by mouth.   Yes [provider]  propranolol ER (INDERAL LA) 80 MG 24 hr capsule TAKE 1 CAPSULE (80 MG TOTAL) BY MOUTH DAILY. 02/01/17  Yes [provider]  azithromycin (ZITHROMAX) 250 MG tablet Take 1 tablet (250 mg total) by mouth daily. Take first 2 tablets together, then 1 every day until finished. Patient not taking: Reported on 06/14/2017 05/31/17   Wieters, Hallie C, PA-C  traMADol (ULTRAM) 50 MG tablet Take 1 tablet (50 mg total) every 6 (six) hours as needed by mouth. Patient not taking: Reported on 06/14/2017 02/08/17   Bethann BerkshireZammit, Joseph, MD    Family History Family History  Problem Relation Age of Onset  . Hypertension Mother   . Stroke Mother   . Migraines Mother   . Hypertension Father   . Migraines Father     Social History Social History   Tobacco Use  . Smoking status: Current Every Day Smoker    Packs/day: 0.25    Types: Cigarettes  .  Smokeless tobacco: Never Used  . Tobacco comment: 01/21/16 one pack every 2.5 days  Substance Use Topics  . Alcohol use: Yes    Comment: Socially - maybe once monthly  . Drug use: No     Allergies   Methotrexate derivatives; Celecoxib; Morphine and related; Other; Penicillins; Sulfa antibiotics; and Tape   Review of Systems Review of Systems  All other systems reviewed and are negative.    Physical Exam Updated Vital Signs BP 130/73   Pulse 72   Temp 98.4 F (36.9 C) (Oral)   Resp (!) 21   Ht 5\' 2"  (1.575 m)   Wt 72.6 kg (160 lb)   LMP 05/24/2017   SpO2 98%   BMI 29.26 kg/m   Physical Exam  Constitutional: She is oriented to person, place, and time. She appears well-developed and well-nourished. No distress.    HENT:  Head: Normocephalic and atraumatic.  Eyes: EOM are normal.  Neck: Normal range of motion.  Cardiovascular: Normal rate, regular rhythm and normal heart sounds.  Pulmonary/Chest: Effort normal and breath sounds normal. No stridor. No respiratory distress. She has no wheezes. She exhibits no tenderness.  Abdominal: Soft. She exhibits no distension. There is no tenderness.  Musculoskeletal: Normal range of motion.  Neurological: She is alert and oriented to person, place, and time.  Skin: Skin is warm and dry.  Psychiatric: She has a normal mood and affect. Judgment normal.  Nursing note and vitals reviewed.    ED Treatments / Results  Labs (all labs ordered are listed, but only abnormal results are displayed) Labs Reviewed  CBC  BASIC METABOLIC PANEL  D-DIMER, QUANTITATIVE (NOT AT [facility])  I-STAT BETA HCG BLOOD, ED ([facility], [facility], [facility] ONLY)    EKG  EKG Interpretation None       Radiology Dg Chest 2 View  Result Date: 06/14/2017 CLINICAL DATA:  Bronchitis for 3 weeks.  Shortness of breath EXAM: CHEST - 2 VIEW COMPARISON:  May 14, 2008 FINDINGS: Lungs are clear. Heart size and pulmonary vascularity are normal. No adenopathy. No bone lesions. IMPRESSION: No edema or consolidation. Electronically Signed   By: Bretta Bang III M.D.   On: 06/14/2017 11:32    Procedures Procedures (including critical care time)  Medications Ordered in ED Medications - No data to display   Initial Impression / Assessment and Plan / ED Course  I have reviewed the triage vital signs and the nursing notes.  Pertinent labs & imaging results that were available during my care of the patient were reviewed by me and considered in my medical decision making (see chart for details).     Nonspecific chest pain. Dimer negative. cxr clear. Doubt ACS. Likely msk related pain. pcp follow up  Final Clinical Impressions(s) / ED Diagnoses   Final diagnoses:  Left sided chest pain    ED  Discharge Orders    None       Azalia Bilis, MD 06/14/17 1259

## 2017-06-14 NOTE — ED Notes (Signed)
Patient transported to X-ray 

## 2017-06-14 NOTE — ED Triage Notes (Signed)
Per [facility]- Pt resides at home. DX bronchitis x 3 weeks. Compliant with meds. Finished antibiotics. Inhalers last used 5 minutes prior to EMS arrival. Pt c/o of productive yellow clear. Denies N/V/D and fever. Denies CP then and at present. Pt c/o of SOB greater with lying down.

## 2018-01-12 IMAGING — CT CT HUMERUS*L* W/CM
3 series · 13 of 34 positions shown, 16 images · IV contrast (agent unspecified)
Comparison: None.

CLINICAL DATA: Hypertension and shortness disease with left
axillary pain. Workup significant for left axillary abscess status
post incision and drainage in the ED. Concern for abscess in the
arm.

EXAM:
CT OF THE UPPER LEFT EXTREMITY WITH CONTRAST
TECHNIQUE: Multidetector CT imaging of the upper left extremity was performed
according to the standard protocol following intravenous contrast
administration.

[Series 8: ax st · axial · 0.38mm/px · z∈[+1581,+1823]mm · 5 of 248 slices shown, 7 images]
[im 39/248  soft-tissue]
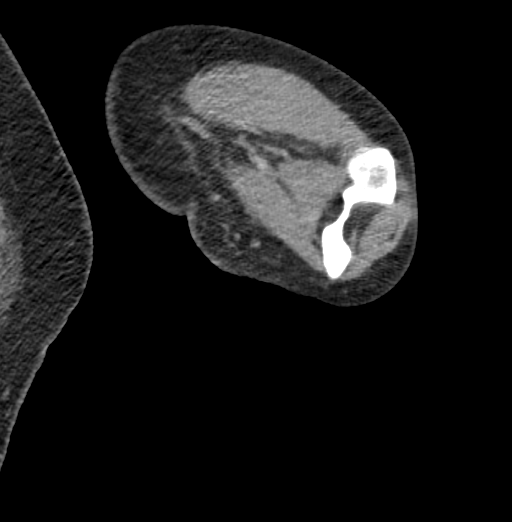
[im 39/248  bone]
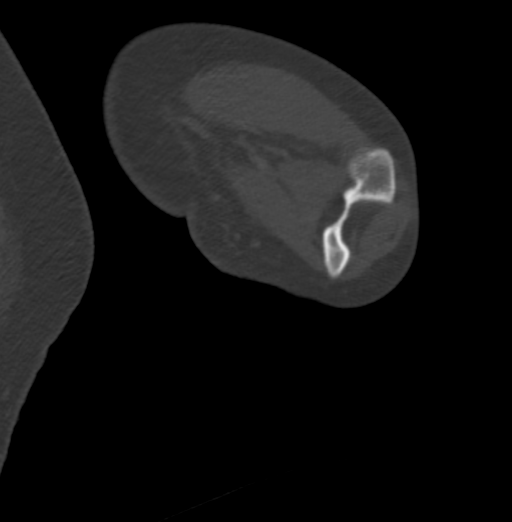
[im 77/248  bone]
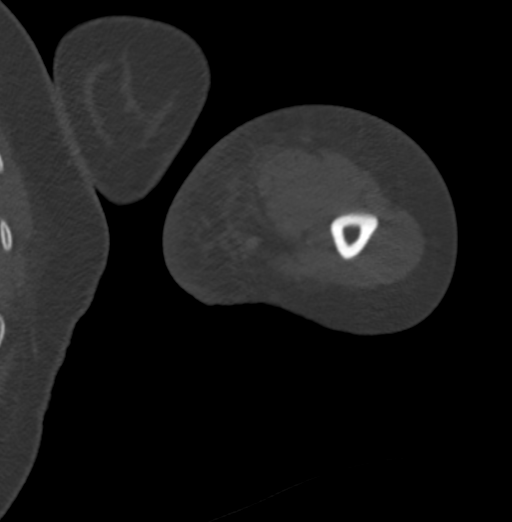
[im 134/248  bone]
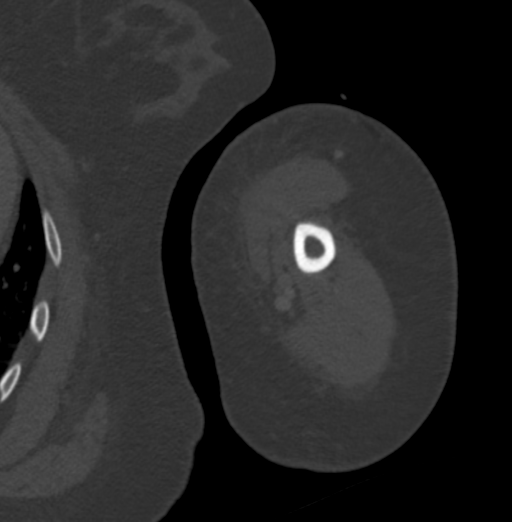
[im 172/248  bone]
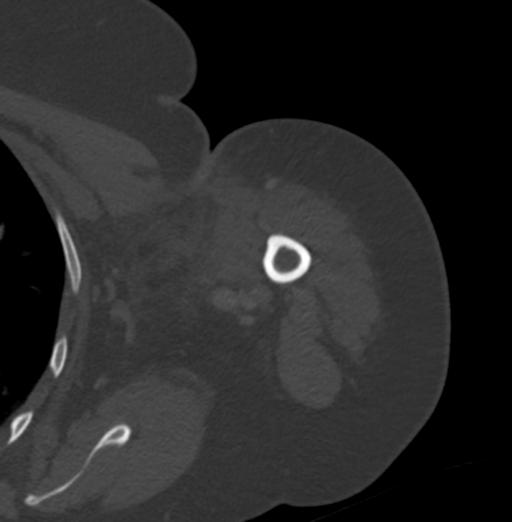
[im 210/248  soft-tissue]
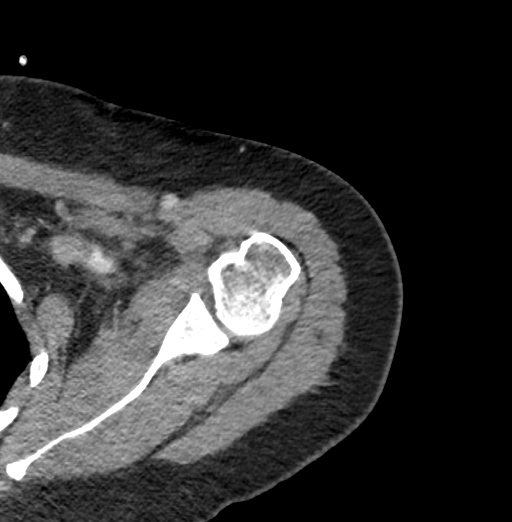
[im 210/248  bone]
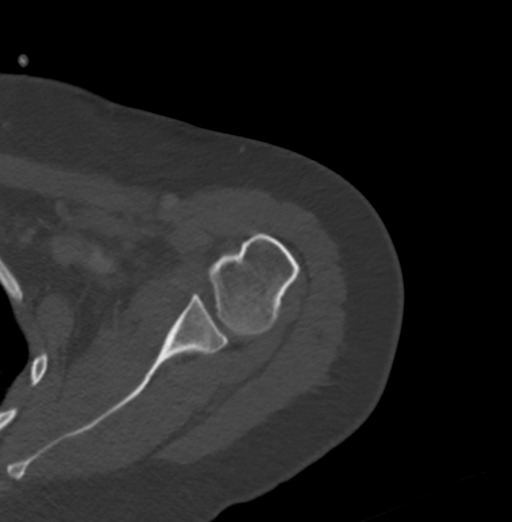

[Series 9: cor st · coronal · 0.57mm/px · 3 of 134 slices shown]
[im 27/134  bone]
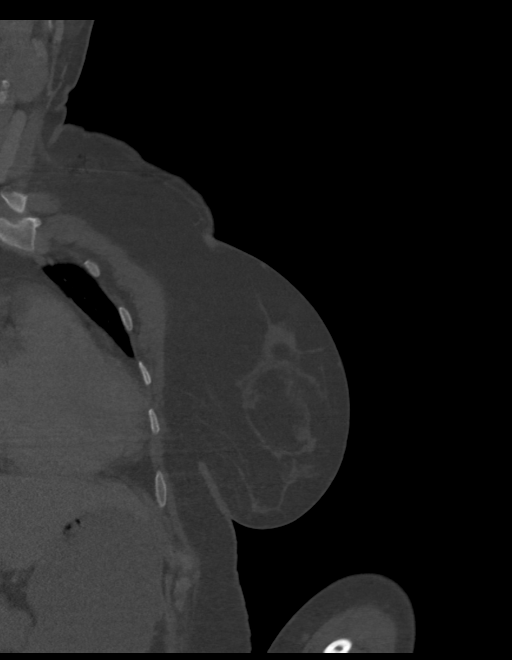
[im 54/134  bone]
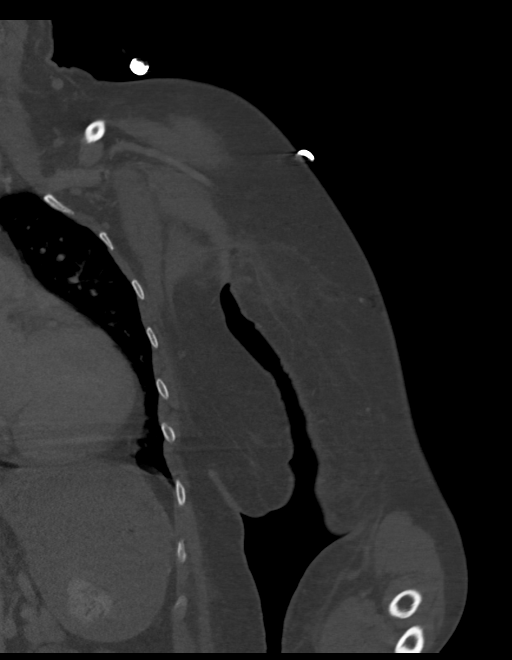
[im 80/134  bone]
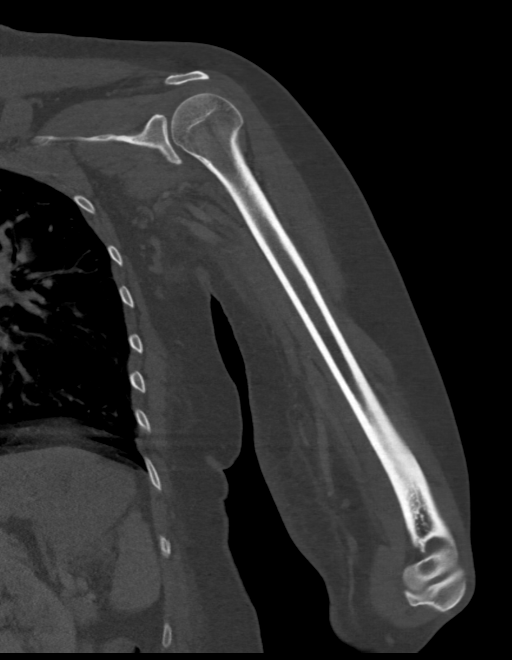

[Series 10: sag st · sagittal · 0.56mm/px · 5 of 134 slices shown, 6 images]
[im 49/134  bone]
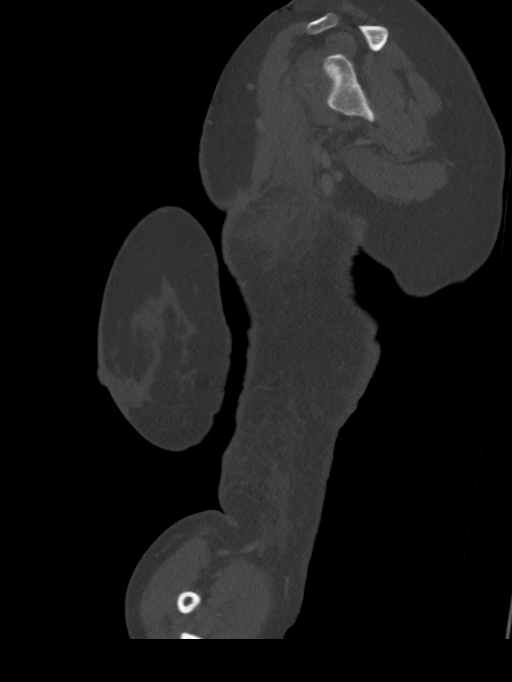
[im 58/134  bone]
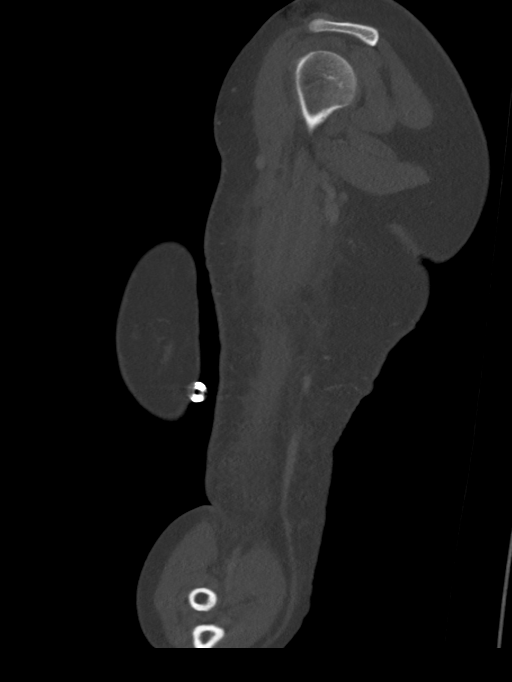
[im 67/134  soft-tissue]
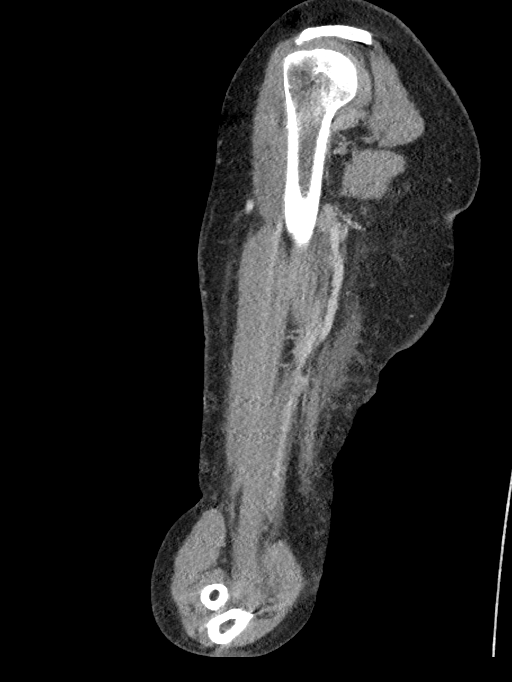
[im 67/134  bone]
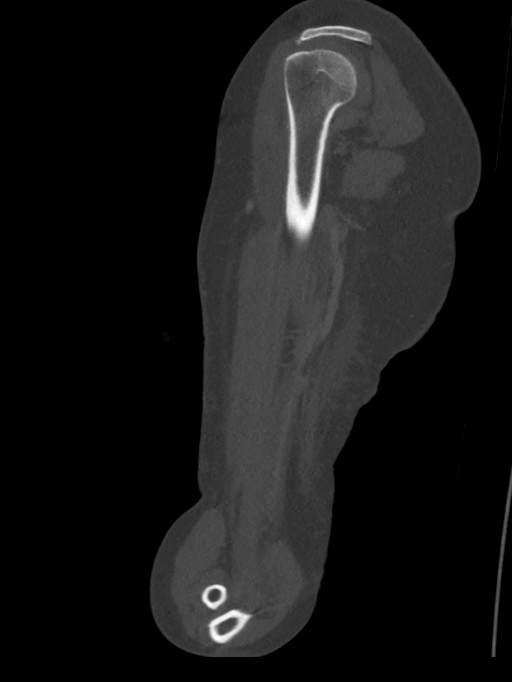
[im 76/134  bone]
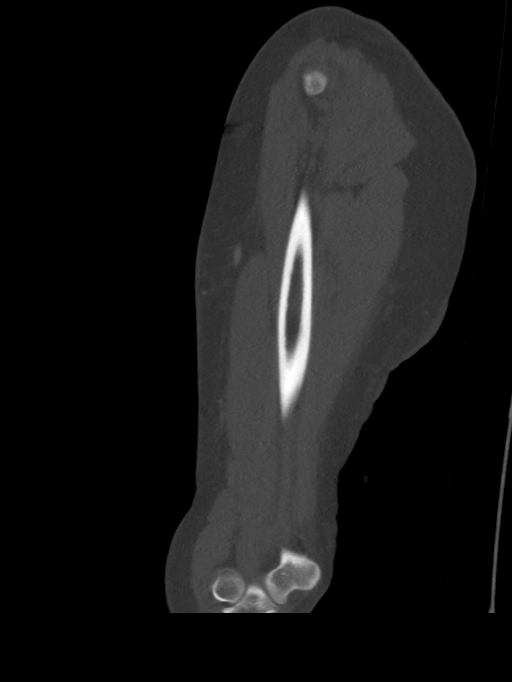
[im 86/134  bone]
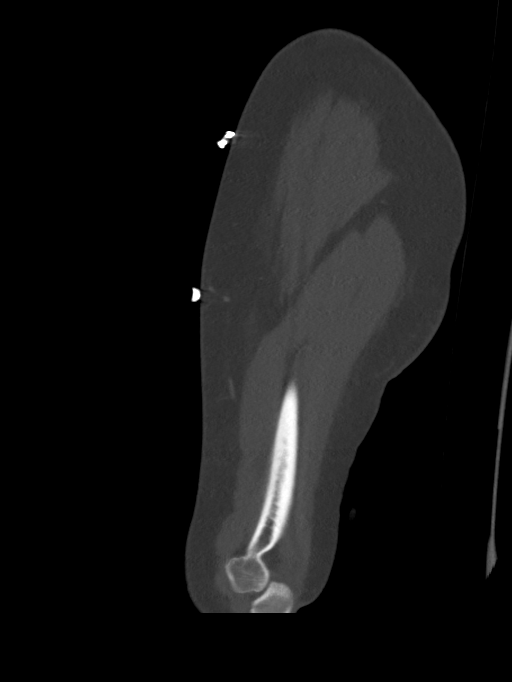

[13 of 34 positions shown; findings below may reference images not displayed]

CONTRAST:  100mL A5P4PS-RNN IOPAMIDOL (A5P4PS-RNN) INJECTION
61%<Contrast>100mL A5P4PS-RNN IOPAMIDOL (A5P4PS-RNN) INJECTION 61%
FINDINGS: Bones/Joint/Cartilage

Nonacute.  No bone destruction.

Ligaments

Suboptimally assessed by CT.

Muscles and Tendons

There soft tissue induration of the subcutaneous fat from left
axilla and coursing over the biceps muscle. Mild reactive edematous
change of the biceps muscle without drainable fluid collection or
abnormal enhancement identified.

Soft tissues

Cellulitis of the left axilla tracking along the ventral and medial
aspect of the left arm without soft tissue abscess identified. Small
reactive lymph nodes in the left axilla measuring up to 6 mm short
axis.
IMPRESSION: No drainable fluid collections of the left arm. Cellulitis from left
axilla to elbow joint with probable mild myositis of the biceps but
no pyomyositis.

No acute fracture nor bone destruction.  The

## 2018-07-06 ENCOUNTER — Ambulatory Visit: Payer: Medicaid Other | Admitting: Family Medicine

## 2018-09-12 ENCOUNTER — Emergency Department (HOSPITAL_COMMUNITY)
Admission: EM | Admit: 2018-09-12 | Discharge: 2018-09-12 | Disposition: A | Discharge status: Home / Self Care | Payer: Medicaid Other | Attending: Emergency Medicine | Admitting: Emergency Medicine

## 2018-09-12 ENCOUNTER — Encounter (HOSPITAL_COMMUNITY): Payer: Self-pay | Admitting: Emergency Medicine

## 2018-09-12 ENCOUNTER — Other Ambulatory Visit: Payer: Self-pay

## 2018-09-12 ENCOUNTER — Emergency Department (HOSPITAL_COMMUNITY): Payer: Medicaid Other

## 2018-09-12 DIAGNOSIS — F17210 Nicotine dependence, cigarettes, uncomplicated: Secondary | ICD-10-CM | POA: Diagnosis not present

## 2018-09-12 DIAGNOSIS — R51 Headache: Secondary | ICD-10-CM | POA: Diagnosis present

## 2018-09-12 DIAGNOSIS — E876 Hypokalemia: Secondary | ICD-10-CM | POA: Diagnosis not present

## 2018-09-12 DIAGNOSIS — S40011A Contusion of right shoulder, initial encounter: Secondary | ICD-10-CM | POA: Diagnosis not present

## 2018-09-12 DIAGNOSIS — H538 Other visual disturbances: Secondary | ICD-10-CM | POA: Insufficient documentation

## 2018-09-12 DIAGNOSIS — Z7982 Long term (current) use of aspirin: Secondary | ICD-10-CM | POA: Diagnosis not present

## 2018-09-12 DIAGNOSIS — R42 Dizziness and giddiness: Secondary | ICD-10-CM | POA: Diagnosis not present

## 2018-09-12 DIAGNOSIS — Y929 Unspecified place or not applicable: Secondary | ICD-10-CM | POA: Diagnosis not present

## 2018-09-12 DIAGNOSIS — S40012A Contusion of left shoulder, initial encounter: Secondary | ICD-10-CM | POA: Insufficient documentation

## 2018-09-12 DIAGNOSIS — H53149 Visual discomfort, unspecified: Secondary | ICD-10-CM | POA: Diagnosis not present

## 2018-09-12 DIAGNOSIS — M2550 Pain in unspecified joint: Secondary | ICD-10-CM

## 2018-09-12 DIAGNOSIS — S0083XA Contusion of other part of head, initial encounter: Secondary | ICD-10-CM | POA: Insufficient documentation

## 2018-09-12 DIAGNOSIS — Y939 Activity, unspecified: Secondary | ICD-10-CM | POA: Insufficient documentation

## 2018-09-12 DIAGNOSIS — M542 Cervicalgia: Secondary | ICD-10-CM | POA: Insufficient documentation

## 2018-09-12 DIAGNOSIS — M3210 Systemic lupus erythematosus, organ or system involvement unspecified: Secondary | ICD-10-CM | POA: Diagnosis not present

## 2018-09-12 DIAGNOSIS — Z79899 Other long term (current) drug therapy: Secondary | ICD-10-CM | POA: Insufficient documentation

## 2018-09-12 DIAGNOSIS — Y999 Unspecified external cause status: Secondary | ICD-10-CM | POA: Diagnosis not present

## 2018-09-12 DIAGNOSIS — I10 Essential (primary) hypertension: Secondary | ICD-10-CM | POA: Insufficient documentation

## 2018-09-12 DIAGNOSIS — R58 Hemorrhage, not elsewhere classified: Secondary | ICD-10-CM

## 2018-09-12 DIAGNOSIS — R519 Headache, unspecified: Secondary | ICD-10-CM

## 2018-09-12 LAB — CBC WITH DIFFERENTIAL/PLATELET
Abs Immature Granulocytes: 0.01 10*3/uL (ref 0.00–0.07)
Basophils Absolute: 0 10*3/uL (ref 0.0–0.1)
Basophils Relative: 0 %
Eosinophils Absolute: 0.5 10*3/uL (ref 0.0–0.5)
Eosinophils Relative: 8 %
HCT: 37.4 % (ref 36.0–46.0)
Hemoglobin: 11.9 g/dL — ABNORMAL LOW (ref 12.0–15.0)
Immature Granulocytes: 0 %
Lymphocytes Relative: 28 %
Lymphs Abs: 1.8 10*3/uL (ref 0.7–4.0)
MCH: 32.6 pg (ref 26.0–34.0)
MCHC: 31.8 g/dL (ref 30.0–36.0)
MCV: 102.5 fL — ABNORMAL HIGH (ref 80.0–100.0)
Monocytes Absolute: 0.8 10*3/uL (ref 0.1–1.0)
Monocytes Relative: 14 %
Neutro Abs: 3 10*3/uL (ref 1.7–7.7)
Neutrophils Relative %: 50 %
Platelets: 261 10*3/uL (ref 150–400)
RBC: 3.65 MIL/uL — ABNORMAL LOW (ref 3.87–5.11)
RDW: 12 % (ref 11.5–15.5)
WBC: 6.2 10*3/uL (ref 4.0–10.5)
nRBC: 0 % (ref 0.0–0.2)

## 2018-09-12 LAB — BASIC METABOLIC PANEL
Anion gap: 9 (ref 5–15)
BUN: 6 mg/dL (ref 6–20)
CO2: 28 mmol/L (ref 22–32)
Calcium: 8.3 mg/dL — ABNORMAL LOW (ref 8.9–10.3)
Chloride: 102 mmol/L (ref 98–111)
Creatinine, Ser: 0.72 mg/dL (ref 0.44–1.00)
GFR calc Af Amer: >60 mL/min (ref >60)
GFR calc non Af Amer: >60 mL/min (ref >60)
Glucose, Bld: 96 mg/dL (ref 70–99)
Potassium: 2.9 mmol/L — ABNORMAL LOW (ref 3.5–5.1)
Sodium: 139 mmol/L (ref 135–145)

## 2018-09-12 LAB — I-STAT BETA HCG BLOOD, ED (MC, WL, AP ONLY): I-stat hCG, quantitative: <5 m[IU]/mL (ref <5)

## 2018-09-12 MED ORDER — IOHEXOL 350 MG/ML SOLN
75.0000 mL | Freq: Once | INTRAVENOUS | Status: AC | PRN
  Administered 2018-09-12: 75 mL via INTRAVENOUS

## 2018-09-12 MED ORDER — HYDROMORPHONE HCL 1 MG/ML IJ SOLN
1.0000 mg | Freq: Once | INTRAMUSCULAR | Status: AC
  Administered 2018-09-12: 17:00:00 1 mg via SUBCUTANEOUS

## 2018-09-12 MED ORDER — ACETAMINOPHEN 500 MG PO TABS: 500.0000 mg | ORAL_TABLET | Freq: Four times a day (QID) | ORAL | 0 refills | Status: DC | PRN

## 2018-09-12 MED ORDER — ONDANSETRON 4 MG PO TBDP
4.0000 mg | ORAL_TABLET | Freq: Once | ORAL | Status: AC
  Administered 2018-09-12: 17:00:00 4 mg via ORAL
  Filled 2018-09-12: qty 1

## 2018-09-12 MED ORDER — POTASSIUM CHLORIDE CRYS ER 20 MEQ PO TBCR
40.0000 meq | EXTENDED_RELEASE_TABLET | Freq: Once | ORAL | Status: AC
  Administered 2018-09-12: 40 meq via ORAL
  Filled 2018-09-12: qty 2

## 2018-09-12 MED ORDER — HYDROMORPHONE HCL 1 MG/ML IJ SOLN
1.0000 mg | Freq: Once | INTRAMUSCULAR | Status: DC
  Filled 2018-09-12: qty 1

## 2018-09-12 MED ORDER — IBUPROFEN 600 MG PO TABS: 600.0000 mg | ORAL_TABLET | Freq: Four times a day (QID) | ORAL | 0 refills | Status: DC | PRN

## 2018-09-12 MED ORDER — HYDROMORPHONE HCL 1 MG/ML IJ SOLN: 0.5000 mg | Freq: Once | INTRAMUSCULAR | Status: DC

## 2018-09-12 MED ORDER — ONDANSETRON HCL 4 MG/2ML IJ SOLN
4.0000 mg | Freq: Once | INTRAMUSCULAR | Status: DC
  Filled 2018-09-12: qty 2

## 2018-09-12 MED ORDER — CYCLOBENZAPRINE HCL 10 MG PO TABS
10.0000 mg | ORAL_TABLET | Freq: Once | ORAL | Status: AC
  Administered 2018-09-12: 10 mg via ORAL
  Filled 2018-09-12: qty 1

## 2018-09-12 MED ORDER — CYCLOBENZAPRINE HCL 10 MG PO TABS: 10.0000 mg | ORAL_TABLET | Freq: Two times a day (BID) | ORAL | 0 refills | Status: DC | PRN

## 2018-09-12 NOTE — Discharge Instructions (Signed)
Alternate 600 mg of ibuprofen and [phone] mg of Tylenol every 3 hours as needed for pain. Do not exceed 4000 mg of Tylenol daily. You may take Flexeril up to twice daily as needed for muscle spasms. This medication may make you drowsy, so I typically only recommended at night. If this medication makes you drowsy throughout the day, no driving, drinking alcohol, or operating heavy machinery. You may also cut these tablets in half. Ice to areas of soreness for the next few days and then may move to heat. Do some gentle stretching throughout the day, especially during hot showers or baths. Take short frequent walks and avoid prolonged periods of sitting or laying. Expect to be sore for the next few day and follow up with primary care physician for recheck of ongoing symptoms but return to ER for emergent changing or worsening of symptoms such as severe headache that gets worse, altered mental status/behaving unusually, persistent vomiting, excessive drowsiness, numbness to the arms or legs, unsteady gait, or slurred speech.  I have attached resources to find a primary care provider but you can also call the number on the back of your insurance card.

## 2018-09-12 NOTE — ED Notes (Signed)
RN attempted for IV x 1 with no success.

## 2018-09-12 NOTE — ED Notes (Signed)
Pt is NSR on monitor 
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
## 2018-09-12 NOTE — ED Notes (Signed)
Pt is sinus brady on monitor 
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
## 2018-09-12 NOTE — ED Notes (Addendum)
Pt has large area of ecchymosis that goes from left buttocks to the entire lower part of back. 2+ left pedal pulse, cap refill less than 3 sec on LLE, LLE has full ROM

## 2018-09-12 NOTE — ED Notes (Signed)
IV team at bedside 

## 2018-11-28 ENCOUNTER — Ambulatory Visit: Payer: Medicaid Other | Admitting: Internal Medicine

## 2018-11-29 ENCOUNTER — Ambulatory Visit: Payer: Medicaid Other

## 2018-12-01 ENCOUNTER — Ambulatory Visit: Payer: Medicaid Other | Admitting: Family Medicine

## 2020-06-07 ENCOUNTER — Emergency Department (HOSPITAL_COMMUNITY)
Admission: EM | Admit: 2020-06-07 | Discharge: 2020-06-07 | Disposition: A | Discharge status: Home / Self Care | Payer: Medicaid Other | Attending: Emergency Medicine | Admitting: Emergency Medicine

## 2020-06-07 ENCOUNTER — Encounter (HOSPITAL_COMMUNITY): Payer: Self-pay

## 2020-06-07 ENCOUNTER — Other Ambulatory Visit: Payer: Self-pay

## 2020-06-07 DIAGNOSIS — X58XXXA Exposure to other specified factors, initial encounter: Secondary | ICD-10-CM | POA: Insufficient documentation

## 2020-06-07 DIAGNOSIS — I10 Essential (primary) hypertension: Secondary | ICD-10-CM | POA: Insufficient documentation

## 2020-06-07 DIAGNOSIS — Y92009 Unspecified place in unspecified non-institutional (private) residence as the place of occurrence of the external cause: Secondary | ICD-10-CM | POA: Diagnosis not present

## 2020-06-07 DIAGNOSIS — T43621A Poisoning by amphetamines, accidental (unintentional), initial encounter: Secondary | ICD-10-CM | POA: Insufficient documentation

## 2020-06-07 DIAGNOSIS — F17210 Nicotine dependence, cigarettes, uncomplicated: Secondary | ICD-10-CM | POA: Diagnosis not present

## 2020-06-07 DIAGNOSIS — Z79899 Other long term (current) drug therapy: Secondary | ICD-10-CM | POA: Insufficient documentation

## 2020-06-07 DIAGNOSIS — T50904A Poisoning by unspecified drugs, medicaments and biological substances, undetermined, initial encounter: Secondary | ICD-10-CM

## 2020-06-07 DIAGNOSIS — T401X1A Poisoning by heroin, accidental (unintentional), initial encounter: Secondary | ICD-10-CM | POA: Insufficient documentation

## 2020-06-07 DIAGNOSIS — Z7982 Long term (current) use of aspirin: Secondary | ICD-10-CM | POA: Insufficient documentation

## 2020-06-07 LAB — CBC
HCT: 41.1 % (ref 36.0–46.0)
Hemoglobin: 13.5 g/dL (ref 12.0–15.0)
MCH: 32.1 pg (ref 26.0–34.0)
MCHC: 32.8 g/dL (ref 30.0–36.0)
MCV: 97.9 fL (ref 80.0–100.0)
Platelets: 242 10*3/uL (ref 150–400)
RBC: 4.2 MIL/uL (ref 3.87–5.11)
RDW: 12.2 % (ref 11.5–15.5)
WBC: 9.3 10*3/uL (ref 4.0–10.5)
nRBC: 0 % (ref 0.0–0.2)

## 2020-06-07 LAB — COMPREHENSIVE METABOLIC PANEL
ALT: 14 U/L (ref 0–44)
AST: 21 U/L (ref 15–41)
Albumin: 4 g/dL (ref 3.5–5.0)
Alkaline Phosphatase: 49 U/L (ref 38–126)
Anion gap: 8 (ref 5–15)
BUN: 15 mg/dL (ref 6–20)
CO2: 26 mmol/L (ref 22–32)
Calcium: 8.8 mg/dL — ABNORMAL LOW (ref 8.9–10.3)
Chloride: 105 mmol/L (ref 98–111)
Creatinine, Ser: 0.6 mg/dL (ref 0.44–1.00)
GFR, Estimated: >60 mL/min (ref >60)
Glucose, Bld: 113 mg/dL — ABNORMAL HIGH (ref 70–99)
Potassium: 3.3 mmol/L — ABNORMAL LOW (ref 3.5–5.1)
Sodium: 139 mmol/L (ref 135–145)
Total Bilirubin: 0.7 mg/dL (ref 0.3–1.2)
Total Protein: 6.4 g/dL — ABNORMAL LOW (ref 6.5–8.1)

## 2020-06-07 LAB — RAPID URINE DRUG SCREEN, HOSP PERFORMED
Amphetamines: POSITIVE — AB
Barbiturates: NOT DETECTED
Benzodiazepines: NOT DETECTED
Cocaine: POSITIVE — AB
Opiates: NOT DETECTED
Tetrahydrocannabinol: POSITIVE — AB

## 2020-06-07 LAB — ETHANOL: Alcohol, Ethyl (B): <10 mg/dL (ref <10)

## 2020-06-07 NOTE — ED Provider Notes (Signed)
5:07 PM Patient with no new complaints.  She is awake, alert, sitting upright.  When asked about what she thinks happened today, sure responds, " hindsight is 20/20", and that she is unlikely to repeat similar mistakes.  With reassuring labs, no ongoing complaints, return to appropriate interactivity, patient discharged in stable condition.   Gerhard Munch, MD 06/07/20 [phone]

## 2020-06-07 NOTE — ED Provider Notes (Signed)
[facility]-EMERGENCY DEPT Provider Note   CSN: [id number] Arrival date & time: 06/07/20  1125     History   Joy Nelson is a 45 y.o. female.  HPI   Patient presents to the ED for evaluation of accidental drug overdose.  Per EMS police were called to the home.  Apparently was for issues with drug overdose of heroin and marijuana.  Patient per nursing report self-administered 0.3 mg of IM Narcan.  Patient in the ED will not tell me what happened.  All she can say right now is that it hurts to pee and she needs to urinate.  After going back into the room the patient did admit that she used heroin today.  She is still not providing a lot of information but does states she was not trying to harm her self.  She does not remember she ever self Narcan.  Past Medical History:  Diagnosis Date  . ADD (attention deficit disorder)   . Anxiety   . Back pain   . Cerebral aneurysm   . Chiari malformation   . Depression   . Fibromyalgia   . GERD (gastroesophageal reflux disease)   . Hypertension   . Hypoglycemia   . Interstitial cystitis   . Kidney stones   . Lupus (HCC)   . Lupus (HCC)   . Migraine   . Osteoarthritis   . Raynaud disease   . Seasonal allergies   . Sjogren's disease [facility])     Patient Active Problem List   Diagnosis Date Noted  . Fibromyalgia 04/24/2016  . History of systemic lupus erythematosus (SLE) (HCC) 04/24/2016  . Benign essential HTN 04/24/2016  . Multilevel degenerative disc disease 04/24/2016  . Unruptured cerebral aneurysm 12/19/2015  . Migraine with aura and without status migrainosus, not intractable 12/19/2015    Past Surgical History:  Procedure Laterality Date  . APPENDECTOMY    . BRAIN SURGERY  02/2014   pipeline stent  . CHOLECYSTECTOMY    . I & D EXTREMITY Left 04/22/2016   Procedure: IRRIGATION AND DEBRIDEMENT LEFT UPPER ARM POSSIBLE ABCESS;  Surgeon: Luretha Murphy, MD;  Location: [facility];  Service: General;   Laterality: Left;  Wound left open  . TONSILLECTOMY    . TUBAL LIGATION       OB History   No obstetric history on file.     Family History  Problem Relation Age of Onset  . Hypertension Mother   . Stroke Mother   . Migraines Mother   . Skin cancer Mother   . Heart disease Mother   . Hyperlipidemia Mother   . Arthritis Mother   . Hypertension Father   . Migraines Father   . Heart disease Father   . Stroke Father   . Aneurysm Father   . Skin cancer Maternal Grandmother   . Arthritis Maternal Grandfather   . Breast cancer Paternal Grandmother   . Diabetes Paternal Grandmother     Social History   Tobacco Use  . Smoking status: Current Every Day Smoker    Packs/day: 0.25    Types: Cigarettes  . Smokeless tobacco: Never Used  . Tobacco comment: 01/21/16 one pack every 2.5 days  Substance Use Topics  . Alcohol use: Yes    Comment: Socially - maybe once monthly  . Drug use: Yes    Types: Marijuana    Home Medications Prior to Admission medications   Medication Sig Start Date End Date Taking? Authorizing Provider  ALPRAZolam Prudy Feeler) 1  MG tablet TAKE 1/2 TABLET BY MOUTH 1 TO 2 TIMES PER DAY    [provider]  aspirin EC 81 MG tablet Take 81 mg daily by mouth.    [provider]  propranolol ER (INDERAL LA) 80 MG 24 hr capsule TAKE 1 CAPSULE (80 MG TOTAL) BY MOUTH DAILY. 02/01/17   [provider]  VIIBRYD 40 MG TABS Take 40 mg by mouth daily. 08/30/18   [provider]  VYVANSE 40 MG capsule TAKE 1 CAPSULE BY MOUTH IN THE MORNING FOR ADD 08/30/18   [provider]    Allergies    Methotrexate derivatives, Celecoxib, Morphine and related, Other, Penicillins, Sulfa antibiotics, and Tape  Review of Systems   Review of Systems  All other systems reviewed and are negative.   Physical Exam Updated Vital Signs BP 137/82   Pulse 75   Temp 98.8 F (37.1 C) (Oral)   Resp 19   SpO2 99%   Physical Exam Vitals and nursing  note reviewed.  Constitutional:      Appearance: She is well-developed. She is not diaphoretic.     Comments: Agitated, disheveled  HENT:     Head: Normocephalic and atraumatic.     Right Ear: External ear normal.     Left Ear: External ear normal.  Eyes:     General: No scleral icterus.       Right eye: No discharge.        Left eye: No discharge.     Conjunctiva/sclera: Conjunctivae normal.  Neck:     Trachea: No tracheal deviation.  Cardiovascular:     Rate and Rhythm: Normal rate and regular rhythm.  Pulmonary:     Effort: Pulmonary effort is normal. No respiratory distress.     Breath sounds: Normal breath sounds. No stridor. No wheezing or rales.  Abdominal:     General: Bowel sounds are normal. There is no distension.     Palpations: Abdomen is soft.     Tenderness: There is no abdominal tenderness. There is no guarding or rebound.  Musculoskeletal:        General: No tenderness.     Cervical back: Neck supple.  Skin:    General: Skin is warm and dry.     Findings: No rash.  Neurological:     Mental Status: She is alert.     Cranial Nerves: No cranial nerve deficit (no facial droop, extraocular movements intact, no slurred speech).     Sensory: No sensory deficit.     Motor: No abnormal muscle tone or seizure activity.     Coordination: Coordination normal.  Psychiatric:        Mood and Affect: Affect is blunt.        Speech: She is noncommunicative.        Behavior: Behavior is withdrawn. Behavior is not aggressive or hyperactive.        Thought Content: Thought content does not include homicidal or suicidal ideation.     ED Results / Procedures / Treatments   Labs (all labs ordered are listed, but only abnormal results are displayed) Labs Reviewed  COMPREHENSIVE METABOLIC PANEL - Abnormal; Notable for the following components:      Result Value   Potassium 3.3 (*)    Glucose, Bld 113 (*)    Calcium 8.8 (*)    Total Protein 6.4 (*)    All other components  within normal limits  RAPID URINE DRUG SCREEN, HOSP PERFORMED - Abnormal; Notable  for the following components:   Cocaine POSITIVE (*)    Amphetamines POSITIVE (*)    Tetrahydrocannabinol POSITIVE (*)    All other components within normal limits  CBC  ETHANOL    EKG None  Radiology No results found.  Procedures Procedures   Medications Ordered in ED Medications - No data to display  ED Course  I have reviewed the triage vital signs and the nursing notes.  Pertinent labs & imaging results that were available during my care of the patient were reviewed by me and considered in my medical decision making (see chart for details).    MDM Rules/Calculators/A&P                          Patient presented to the ED for evaluation of overdose.  Patient is very somnolent but denied any suicidal ideation.  In the ED she is remained very somnolent after her overdose.  She was positive for cocaine and amphetamines and marijuana.  Patient appears hemodynamically stable.  We will continue to monitor in the ED and plan on reassessing when she is more alert.  Care turned over to Dr. Jeraldine Loots. Final Clinical Impression(s) / ED Diagnoses Final diagnoses:  Drug overdose, undetermined intent, initial encounter      Linwood Dibbles, MD 06/07/20 1527

## 2020-06-07 NOTE — Discharge Instructions (Signed)
Please do not use illicit substances.  Follow-up with your physician or return here for concerning changes in your condition.

## 2020-06-07 NOTE — ED Triage Notes (Signed)
Patient coming from home with drug overdose with heroin and marijuana. [employer] at the scene.Patient was self administer narcan and EMS give 0.3 mg IM. Patient refuse to answer question.

## 2021-11-03 DIAGNOSIS — F1120 Opioid dependence, uncomplicated: Secondary | ICD-10-CM | POA: Insufficient documentation

## 2021-11-04 DIAGNOSIS — F319 Bipolar disorder, unspecified: Secondary | ICD-10-CM | POA: Insufficient documentation

## 2021-11-06 DIAGNOSIS — M1990 Unspecified osteoarthritis, unspecified site: Secondary | ICD-10-CM | POA: Insufficient documentation

## 2021-11-06 DIAGNOSIS — E282 Polycystic ovarian syndrome: Secondary | ICD-10-CM | POA: Insufficient documentation

## 2021-11-29 ENCOUNTER — Other Ambulatory Visit: Payer: Self-pay

## 2021-11-29 ENCOUNTER — Emergency Department
Admission: EM | Admit: 2021-11-29 | Discharge: 2021-11-29 | Disposition: A | Discharge status: Home / Self Care | Payer: Medicaid Other | Attending: Emergency Medicine | Admitting: Emergency Medicine

## 2021-11-29 ENCOUNTER — Emergency Department: Payer: Medicaid Other

## 2021-11-29 DIAGNOSIS — R600 Localized edema: Secondary | ICD-10-CM | POA: Diagnosis not present

## 2021-11-29 DIAGNOSIS — J40 Bronchitis, not specified as acute or chronic: Secondary | ICD-10-CM | POA: Diagnosis not present

## 2021-11-29 DIAGNOSIS — J209 Acute bronchitis, unspecified: Secondary | ICD-10-CM

## 2021-11-29 DIAGNOSIS — R079 Chest pain, unspecified: Secondary | ICD-10-CM

## 2021-11-29 DIAGNOSIS — I10 Essential (primary) hypertension: Secondary | ICD-10-CM | POA: Insufficient documentation

## 2021-11-29 DIAGNOSIS — Z20822 Contact with and (suspected) exposure to covid-19: Secondary | ICD-10-CM | POA: Diagnosis not present

## 2021-11-29 DIAGNOSIS — R0789 Other chest pain: Secondary | ICD-10-CM | POA: Diagnosis present

## 2021-11-29 LAB — SARS CORONAVIRUS 2 BY RT PCR: SARS Coronavirus 2 by RT PCR: NEGATIVE

## 2021-11-29 LAB — BASIC METABOLIC PANEL
Anion gap: 15 (ref 5–15)
BUN: 19 mg/dL (ref 6–20)
CO2: 22 mmol/L (ref 22–32)
Calcium: 9.4 mg/dL (ref 8.9–10.3)
Chloride: 105 mmol/L (ref 98–111)
Creatinine, Ser: 0.7 mg/dL (ref 0.44–1.00)
GFR, Estimated: >60 mL/min (ref >60)
Glucose, Bld: 84 mg/dL (ref 70–99)
Potassium: 4 mmol/L (ref 3.5–5.1)
Sodium: 142 mmol/L (ref 135–145)

## 2021-11-29 LAB — CBC
HCT: 48.2 % — ABNORMAL HIGH (ref 36.0–46.0)
Hemoglobin: 15.7 g/dL — ABNORMAL HIGH (ref 12.0–15.0)
MCH: 31 pg (ref 26.0–34.0)
MCHC: 32.6 g/dL (ref 30.0–36.0)
MCV: 95.1 fL (ref 80.0–100.0)
Platelets: 320 10*3/uL (ref 150–400)
RBC: 5.07 MIL/uL (ref 3.87–5.11)
RDW: 12.3 % (ref 11.5–15.5)
WBC: 6.5 10*3/uL (ref 4.0–10.5)
nRBC: 0 % (ref 0.0–0.2)

## 2021-11-29 LAB — TROPONIN I (HIGH SENSITIVITY)
Troponin I (High Sensitivity): 9 ng/L (ref <18)
Troponin I (High Sensitivity): 8 ng/L (ref <18)

## 2021-11-29 MED ORDER — IOHEXOL 350 MG/ML SOLN
75.0000 mL | Freq: Once | INTRAVENOUS | Status: AC | PRN
Start: 2021-11-29 — End: 2021-11-29
  Administered 2021-11-29: 75 mL via INTRAVENOUS

## 2021-11-29 MED ORDER — ONDANSETRON 4 MG PO TBDP: 4.0000 mg | ORAL_TABLET | Freq: Once | ORAL | Status: DC

## 2021-11-29 MED ORDER — PROCHLORPERAZINE MALEATE 5 MG PO TABS
5.0000 mg | ORAL_TABLET | Freq: Once | ORAL | Status: DC
  Filled 2021-11-29: qty 1

## 2021-11-29 MED ORDER — AZITHROMYCIN 250 MG PO TABS: ORAL_TABLET | ORAL | 0 refills | Status: AC

## 2021-11-29 NOTE — ED Provider Triage Note (Signed)
Emergency Medicine Provider Triage Evaluation Note  Lynnleigh Soden, a 46 y.o. female  was evaluated in triage.  Pt complains of chest pain. She notes onset of symptoms this morning. She notes left-side pain with referral through her shoulder. She also notes some intermittent left hand paresthesias. She is currently on metoprolol for HTN, and gives history of "leaky valve".  Review of Systems  Positive: CP Negative: FCS  Physical Exam  BP (!) 141/77 (BP Location: Left Arm)   Pulse 61   Temp 99.4 F (37.4 C) (Oral)   Resp 18   Ht 5\' 1"  (1.549 m)   Wt 59 kg   SpO2 99%   BMI 24.56 kg/m  Gen:   Awake, no distress  NAD Resp:  Normal effort CTA MSK:   Moves extremities without difficulty  CVS:  RRR  Medical Decision Making  Medically screening exam initiated at 5:36 PM.  Appropriate orders placed.  Rafaela Dinius was informed that the remainder of the evaluation will be completed by another provider, this initial triage assessment does not replace that evaluation, and the importance of remaining in the ED until their evaluation is complete.  Patient [facility] the [facility], with complaints of chest pain to the left salmonellosis morning at 10 AM.  She denies any shortness of breath or diaphoresis, but does endorse some intermittent nausea and vomiting.   [place], PA-C 11/29/21 1739

## 2021-11-29 NOTE — ED Triage Notes (Signed)
Pt states she has been having L sided chest pain that goes into her back for the past few hours- pt states that she also had some vomiting

## 2021-11-29 NOTE — ED Provider Notes (Signed)
[facility] Provider Note    Event Date/Time   First MD Initiated Contact with Patient 11/29/21 2017     (approximate)  History   Chief Complaint: Chest Pain  HPI  Chinyere Galiano is a 46 y.o. female with a past medical history of ADD, fibromyalgia, gastric reflux, hypertension, lupus, presents the emergency department for chest pain.  Cording to the patient since around 12 PM today she has been experiencing central chest pain radiating to her central back.  Patient states the pain is worse with deep inspiration.  Patient states she has had some mild cough and congestion over the past 2 days which she thought was just allergies.  Denies any significant shortness of breath or diaphoresis.  Physical Exam   Triage Vital Signs: ED Triage Vitals  Enc Vitals Group     BP 11/29/21 1542 (!) 141/77     Pulse Rate 11/29/21 1542 61     Resp 11/29/21 1542 18     Temp 11/29/21 1542 99.4 F (37.4 C)     Temp Source 11/29/21 1542 Oral     SpO2 11/29/21 1542 99 %     Weight 11/29/21 1539 130 lb (59 kg)     Height 11/29/21 1539 5\' 1"  (1.549 m)     Head Circumference --      Peak Flow --      Pain Score 11/29/21 1539 8     Pain Loc --      Pain Edu? --      Excl. in GC? --     Most recent vital signs: Vitals:   11/29/21 1542  BP: (!) 141/77  Pulse: 61  Resp: 18  Temp: 99.4 F (37.4 C)  SpO2: 99%    General: Awake, no distress.  CV:  Good peripheral perfusion.  Regular rate and rhythm  Resp:  Normal effort.  Equal breath sounds bilaterally.  Abd:  No distention.  Soft, nontender.  No rebound or guarding. Other:  Lower extremity edema or tenderness to palpation.   ED Results / Procedures / Treatments   EKG  EKG viewed and interpreted by myself shows a normal sinus rhythm at 67 bpm with a narrow QRS, normal axis, normal intervals, no concerning ST changes.  RADIOLOGY  I have reviewed and interpreted the chest x-ray images I do not see any obvious  consolidation on my evaluation. Radiology is read the chest x-ray is negative CTA of the chest is negative for PE.  No concerning findings.  MEDICATIONS ORDERED IN ED: Medications  prochlorperazine (COMPAZINE) tablet 5 mg (has no administration in time range)     IMPRESSION / MDM / ASSESSMENT AND PLAN / ED COURSE  I reviewed the triage vital signs and the nursing notes.  Patient's presentation is most consistent with acute presentation with potential threat to life or bodily function.  Patient presents to the emergency department for central chest pain radiating to her back worse with deep inspiration.  Patient also states a mild cough and congestion.  Patient has a low-grade borderline temperature 99.4 in the emergency department.  Otherwise reassuring vitals with normal pulse rate.  Denies any hormone or estrogen treatments.  Given the patient's pleuritic nature of the pain we will obtain a CTA of the chest.  Patient's initial lab work is reassuring including a normal CBC, chemistry and a negative troponin.  We will recheck a troponin obtain a CTA of the chest and swab for COVID.  Patient agreeable to plan  of care.  Lab work is reassuring including negative troponin x2.  COVID test is negative.  CTA is negative for PE.  We will discharge the patient home with a short course of antibiotics for possible acute bronchitis.  FINAL CLINICAL IMPRESSION(S) / ED DIAGNOSES   Chest pain Bronchitis  Note:  This document was prepared using Dragon voice recognition software and may include unintentional dictation errors.   Minna Antis, MD 11/29/21 2239

## 2021-12-25 ENCOUNTER — Emergency Department (HOSPITAL_COMMUNITY)
Admission: EM | Admit: 2021-12-25 | Discharge: 2021-12-25 | Disposition: A | Discharge status: Home / Self Care | Payer: Medicaid Other | Attending: Emergency Medicine | Admitting: Emergency Medicine

## 2021-12-25 ENCOUNTER — Other Ambulatory Visit: Payer: Self-pay

## 2021-12-25 ENCOUNTER — Encounter (HOSPITAL_COMMUNITY): Payer: Self-pay | Admitting: Radiology

## 2021-12-25 DIAGNOSIS — Z7982 Long term (current) use of aspirin: Secondary | ICD-10-CM | POA: Insufficient documentation

## 2021-12-25 DIAGNOSIS — T50901A Poisoning by unspecified drugs, medicaments and biological substances, accidental (unintentional), initial encounter: Secondary | ICD-10-CM | POA: Diagnosis present

## 2021-12-25 DIAGNOSIS — T40601A Poisoning by unspecified narcotics, accidental (unintentional), initial encounter: Secondary | ICD-10-CM

## 2021-12-25 DIAGNOSIS — R0981 Nasal congestion: Secondary | ICD-10-CM | POA: Diagnosis not present

## 2021-12-25 DIAGNOSIS — X58XXXA Exposure to other specified factors, initial encounter: Secondary | ICD-10-CM | POA: Insufficient documentation

## 2021-12-25 DIAGNOSIS — T402X1A Poisoning by other opioids, accidental (unintentional), initial encounter: Secondary | ICD-10-CM | POA: Insufficient documentation

## 2021-12-25 DIAGNOSIS — J3489 Other specified disorders of nose and nasal sinuses: Secondary | ICD-10-CM | POA: Diagnosis not present

## 2021-12-25 MED ORDER — NALOXONE HCL 4 MG/0.1ML NA LIQD
1.0000 | Freq: Once | NASAL | 0 refills | Status: AC
Start: 2021-12-25 — End: 2021-12-25

## 2021-12-25 NOTE — ED Provider Notes (Signed)
[facility]-EMERGENCY DEPT Provider Note   CSN: [id number] Arrival date & time: 12/25/21  1459     History {Add pertinent medical, surgical, social history, OB history to HPI:1} Chief Complaint  Patient presents with   Drug Overdose    Joy Nelson is a 46 y.o. female.  She is brought in by EMS after an unresponsive episode.  She was given Narcan with improvement in her mental status.  She admits to snorting heroin just prior to this event.  She said she missed her methadone dose this morning and was trying to use just enough drugs to not withdrawal.  She denies any self-harm.  She is chronic opiate user.  She said she has a history of cerebral aneurysm lupus interstitial cystitis.  Currently complaining some nasal congestion and fogginess in her head.  No chest pain or shortness of breath.  The history is provided by the patient.  Drug Overdose This is a recurrent problem. The current episode started less than 1 hour ago. The problem has been rapidly improving. Pertinent negatives include no chest pain, no abdominal pain, no headaches and no shortness of breath. Exacerbated by: Drug use. Relieved by: Narcan. Treatments tried: Narcan. The treatment provided significant relief.       Home Medications Prior to Admission medications   Medication Sig Start Date End Date Taking? Authorizing Provider  ALPRAZolam (XANAX) 1 MG tablet TAKE 1/2 TABLET BY MOUTH 1 TO 2 TIMES PER DAY    [provider]  aspirin EC 81 MG tablet Take 81 mg daily by mouth.    [provider]  propranolol ER (INDERAL LA) 80 MG 24 hr capsule TAKE 1 CAPSULE (80 MG TOTAL) BY MOUTH DAILY. 02/01/17   [provider]  VIIBRYD 40 MG TABS Take 40 mg by mouth daily. 08/30/18   [provider]  VYVANSE 40 MG capsule TAKE 1 CAPSULE BY MOUTH IN THE MORNING FOR ADD 08/30/18   [provider]      Allergies    Methotrexate derivatives, Celecoxib, Morphine and related,  Other, Penicillins, Sulfa antibiotics, and Tape    Review of Systems   Review of Systems  Constitutional:  Negative for fever.  HENT:  Positive for rhinorrhea.   Respiratory:  Negative for shortness of breath.   Cardiovascular:  Negative for chest pain.  Gastrointestinal:  Negative for abdominal pain.  Neurological:  Negative for headaches.    Physical Exam Updated Vital Signs BP (!) 145/95 (BP Location: Left Arm)   Pulse 70   Temp 98.1 F (36.7 C) (Oral)   Resp 14   Ht 5\' 1"  (1.549 m)   Wt 57.6 kg   SpO2 98%   BMI 24.00 kg/m  Physical Exam Vitals and nursing note reviewed.  Constitutional:      General: She is not in acute distress.    Appearance: Normal appearance. She is well-developed.  HENT:     Head: Normocephalic and atraumatic.  Eyes:     Conjunctiva/sclera: Conjunctivae normal.  Cardiovascular:     Rate and Rhythm: Normal rate and regular rhythm.     Heart sounds: No murmur heard. Pulmonary:     Effort: Pulmonary effort is normal. No respiratory distress.     Breath sounds: Normal breath sounds.  Abdominal:     Palpations: Abdomen is soft.     Tenderness: There is no abdominal tenderness. There is no guarding or rebound.  Musculoskeletal:        General: No swelling or  deformity.     Cervical back: Neck supple.  Skin:    General: Skin is warm and dry.     Capillary Refill: Capillary refill takes less than 2 seconds.  Neurological:     General: No focal deficit present.     Mental Status: She is alert.     ED Results / Procedures / Treatments   Labs (all labs ordered are listed, but only abnormal results are displayed) Labs Reviewed - No data to display  EKG None  Radiology No results found.  Procedures Procedures  {Document cardiac monitor, telemetry assessment procedure when appropriate:1}  Medications Ordered in ED Medications - No data to display  ED Course/ Medical Decision Making/ A&P                           Medical Decision  Making  This patient complains of ***; this involves an extensive number of treatment Options and is a complaint that carries with it a high risk of complications and morbidity. The differential includes ***  I ordered, reviewed and interpreted labs, which included *** I ordered medication *** and reviewed PMP when indicated. I ordered imaging studies which included *** and I independently    visualized and interpreted imaging which showed *** Additional history obtained from *** Previous records obtained and reviewed *** I consulted *** and discussed lab and imaging findings and discussed disposition.  Cardiac monitoring reviewed, *** Social determinants considered, *** Critical Interventions: ***  After the interventions stated above, I reevaluated the patient and found *** Admission and further testing considered, ***   {Document critical care time when appropriate:1} {Document review of labs and clinical decision tools ie heart score, Chads2Vasc2 etc:1}  {Document your independent review of radiology images, and any outside records:1} {Document your discussion with family members, caretakers, and with consultants:1} {Document social determinants of health affecting pt's care:1} {Document your decision making why or why not admission, treatments were needed:1} Final Clinical Impression(s) / ED Diagnoses Final diagnoses:  None    Rx / DC Orders ED Discharge Orders     None

## 2021-12-25 NOTE — ED Notes (Signed)
Pt discharge with instructions to pick up narcan prescription. Pt understands.

## 2021-12-25 NOTE — ED Notes (Signed)
On arrival patient is fully alert and oriented.

## 2021-12-25 NOTE — ED Triage Notes (Signed)
Pt found unresponsive in a yard. Was given 2mg  narcan by [employer] and became responsive. Pt admitted to using heroin states she missed her methadone appointment this morning.

## 2022-03-31 ENCOUNTER — Ambulatory Visit: Payer: Medicaid Other | Admitting: Internal Medicine

## 2022-04-05 ENCOUNTER — Ambulatory Visit: Payer: Medicaid Other | Admitting: Internal Medicine

## 2022-04-05 NOTE — Progress Notes (Deleted)
HTN MIGRAINE UNRUPTURED ANEURYSM DDD HX SLE FIBROMYALG  Patient had overdose after using heroin 11/2021. She responded well to narcan at that time. She had missed her methadone dose the morning of her overdose and was using heroin to avoid withdrawal.

## 2022-04-23 NOTE — Progress Notes (Incomplete)
   CC: ***  HPI:   [name] is a 47 y.o. female with a past medical history of hypertension, SLE, Raynaud-Sjogren disease, fibromyalgia, migraines, ADHD, anxiety-depression, and opioid use who presents to establish care at [facility].    Acute complaints? X   Medical history, medications X Alprazolam Aspirin Propranolol Vilazodone (SSRI) Lisdexamfetamine   Surgical history X  Family history X  Occupation X  Home X  Drugs x     Past Medical History:  Diagnosis Date   ADD (attention deficit disorder)    Anxiety    Back pain    Cerebral aneurysm    Chiari malformation    Depression    Fibromyalgia    GERD (gastroesophageal reflux disease)    Hypertension    Hypoglycemia    Interstitial cystitis    Kidney stones    Lupus (HCC)    Lupus (HCC)    Migraine    Osteoarthritis    Raynaud disease    Seasonal allergies    Sjogren's disease ([place])      Review of Systems:    Reports *** Denies *** (subjective fever?, pain anywhere?, bowel changes?)   Physical Exam:  There were no vitals filed for this visit.  General:   awake and alert, sitting comfortably in chair, cooperative, not in acute distress Skin:   warm and dry, intact without any obvious lesions or scars, no rashes or lesions  Head:   normocephalic and atraumatic, oral mucosa moist with good dentition, no lymphadenopathy Eyes:   extraocular movements intact, conjunctivae pink, pupils round and reactive to light, no periorbital swelling or scleral icterus Ears:   pinnae normal, no discharge or external lesions  Nose:   symmetrical and without mucosal inflammation, no external lesions or discharge Lungs:   normal respiratory effort, breathing unlabored, symmetrical chest rise, no crackles or wheezing Cardiac:   regular rate and rhythm, normal S1 and S2, capillary refill 2-3 seconds, dorsalis pedis pulses intact bilaterally, no pitting edema Abdomen:   soft and non-distended, normoactive  bowel sounds present in all four quadrants, no guarding or palpable masses Musculoskeletal:   full range of motion in joints, motor strength 5 /5 in all four extremities, no obvious deformities or joint tenderness Neurologic:   oriented to person-place-time, moving all extremities, sensation to light touch intact, no gross focal deficits Psychiatric:   euthymic mood with congruent affect, intelligible speech    Assessment & Plan:   No problem-specific Assessment & Plan notes found for this encounter.     See Encounters Tab for problem based charting.  Patient {GC/GE:3044014::"discussed with","seen with"} Dr. {NAMES:[id number]::"Guilloud","Hoffman","Mullen","Narendra","Williams","Vincent"}

## 2022-04-27 ENCOUNTER — Ambulatory Visit: Payer: Medicaid Other | Admitting: Student

## 2022-05-11 ENCOUNTER — Encounter: Payer: Self-pay | Admitting: Student

## 2022-05-11 ENCOUNTER — Ambulatory Visit: Payer: Medicaid Other | Admitting: Student

## 2022-05-11 ENCOUNTER — Other Ambulatory Visit: Payer: Self-pay

## 2022-05-11 VITALS — BP 123/78 | HR 62 | Temp 98.7°F | Ht 61.0 in | Wt 116.7 lb

## 2022-05-11 DIAGNOSIS — F909 Attention-deficit hyperactivity disorder, unspecified type: Secondary | ICD-10-CM | POA: Diagnosis not present

## 2022-05-11 DIAGNOSIS — R634 Abnormal weight loss: Secondary | ICD-10-CM

## 2022-05-11 DIAGNOSIS — F419 Anxiety disorder, unspecified: Secondary | ICD-10-CM

## 2022-05-11 DIAGNOSIS — Z114 Encounter for screening for human immunodeficiency virus [HIV]: Secondary | ICD-10-CM

## 2022-05-11 DIAGNOSIS — F1190 Opioid use, unspecified, uncomplicated: Secondary | ICD-10-CM | POA: Diagnosis not present

## 2022-05-11 DIAGNOSIS — Z1159 Encounter for screening for other viral diseases: Secondary | ICD-10-CM

## 2022-05-11 DIAGNOSIS — M329 Systemic lupus erythematosus, unspecified: Secondary | ICD-10-CM | POA: Diagnosis not present

## 2022-05-11 DIAGNOSIS — I10 Essential (primary) hypertension: Secondary | ICD-10-CM | POA: Diagnosis present

## 2022-05-11 DIAGNOSIS — M797 Fibromyalgia: Secondary | ICD-10-CM

## 2022-05-11 DIAGNOSIS — N6310 Unspecified lump in the right breast, unspecified quadrant: Secondary | ICD-10-CM | POA: Diagnosis not present

## 2022-05-11 DIAGNOSIS — F17210 Nicotine dependence, cigarettes, uncomplicated: Secondary | ICD-10-CM

## 2022-05-11 DIAGNOSIS — L989 Disorder of the skin and subcutaneous tissue, unspecified: Secondary | ICD-10-CM | POA: Diagnosis not present

## 2022-05-11 DIAGNOSIS — D649 Anemia, unspecified: Secondary | ICD-10-CM | POA: Diagnosis not present

## 2022-05-11 DIAGNOSIS — N6320 Unspecified lump in the left breast, unspecified quadrant: Secondary | ICD-10-CM | POA: Diagnosis not present

## 2022-05-11 DIAGNOSIS — F17200 Nicotine dependence, unspecified, uncomplicated: Secondary | ICD-10-CM

## 2022-05-11 DIAGNOSIS — N63 Unspecified lump in unspecified breast: Secondary | ICD-10-CM

## 2022-05-11 DIAGNOSIS — T07XXXA Unspecified multiple injuries, initial encounter: Secondary | ICD-10-CM

## 2022-05-11 MED ORDER — NICOTINE 14 MG/24HR TD PT24: 14.0000 mg | MEDICATED_PATCH | TRANSDERMAL | 0 refills | Status: AC

## 2022-05-11 NOTE — Progress Notes (Unsigned)
CC: establish care  HPI:  [name] is a 47 y.o. person with medical history as below presenting to [facility] for establishing care.  Please see problem-based list for further details, assessments, and plans.  Past Medical History:  Diagnosis Date   ADD (attention deficit disorder)    Anxiety    Back pain    Cerebral aneurysm    Chiari malformation    Depression    Fibromyalgia    GERD (gastroesophageal reflux disease)    Hypertension    Hypoglycemia    Interstitial cystitis    Kidney stones    Lupus (HCC)    Lupus (HCC)    Migraine    Osteoarthritis    Raynaud disease    Seasonal allergies    Sjogren's disease ([place])    Review of Systems:  As per HPI  Physical Exam:  Vitals:   05/11/22 1612  BP: 123/78  Pulse: 62  Temp: 98.7 F (37.1 C)  TempSrc: Oral  SpO2: 96%  Weight: 116 lb 11.2 oz (52.9 kg)  Height: 5' 1"$  (1.549 m)    General: Appears older than stated age, anxious but in no acute distress HENT: Normocephalic, atraumatic. No cervical lymphadenopathy.  Eyes: Vision grossly in tact. No scleral icterus or conjunctival injection. CV: Regular rate, rhythm. No murmurs appreciated. Warm extremities. Pulm: Normal work of breathing on room air. Clear to auscultation bilaterally. Breasts: Appear symmetric, no nipple discharge or overlying skin changes appreciated. Small, firm, painless nodule appreciated in lower outer quadrant of right breast. Larger, firm nodule with mild tenderness in upper outer quadrant of left breast. No axillary lymphadenopathy appreciated bilaterally. GI: Abdomen soft, non-tender, non-distended. Normoactive bowel sounds appreciated.  MSK: Normal bulk, tone. No peripheral edema appreciated.  Skin: Warm, dry. No malar rash appreciated. Many dime-sized excoriated lesions on chest, bilateral arms, back, face, and scalp. None have purulent drainage or streaking erythema. Varying stages of healing process.  Neuro: Awake, alert, conversing  appropriately. Grossly non-focal.  Psych: Anxious, normal affect.  Assessment & Plan:   History of systemic lupus erythematosus (SLE) Patient reports history of lupus. I do not see any records or any labs confirming diagnosis. I do see an ANA that was negative in 2013 in [place]. Patient does not recall her previous primary care office, she is to call us when she has that information.   - Follow-up with previous primary care records  ADHD (attention deficit hyperactivity disorder) Patient is seen by Dr. Sanjuana Letters, a psychiatrist for this problem. Patient currently takes Adderall 19m XR, prescribed by Dr. ISanjuana Letters On exam, patient does appear quite fidgety and anxious, I am wondering if adderrall is contributing to this. In addition, she has many excoriated lesions throughout that she reports she picks at, which certainly could be worsened with amphetamine use. Will have her continue following up with psychiatry.  - Follow-up with psychiatry   Benign essential HTN BP Readings from Last 3 Encounters:  05/11/22 123/78  12/25/21 104/64  11/29/21 99/78   Blood pressure today at goal, currently not on any antihypertensive. She denies any chest pain, dyspnea, lightheadedness. Will get BMP today.  - BMP today  Unintentional weight loss Patient reports roughly 25lb weight loss over the last three months. She states her appetite has remained normal. She was previously unhomed but has been staying with a friend over this time. Mentions her diet has mainly consisted of carbohydrates, such as pasta. She denies any fevers, chills, night sweats, hematochezia, hematemesis, hair/skin changes, heat/cold  intolerance.   Unclear on why patient would be losing this weight despite having a normal appetite. She is not currently up to date on cancer screenings, she needs a PAP smear. We are also going to get a mammogram for a few breast lumps. Today's visit was cut short, as she is a new patient and we  were unable to fully discuss everything. Today we will obtain blood work and have her return in a month for an hour visit for further evaluation.  - Follow-up CBC, CMP, TSH, HIV, hepatitis C Ab - Needs age appropriate cancer screenings - Return in one month for further evaluation   Bilateral breast lump Patient is presenting today with two breast lumps. Describes a painless lump on her right breast she has noticed for the last few months. She denies any changes with menstrual cycles, nipple changes, discharge, skin changes. She states she has a family history of breast cancer and would like for these to be worked up. On exam today, patient has a lump in each of her breasts, one on the right is mildly tender to palpation. There is no skin or nipple changes or discharge appreciated. No axillary LAD as well. We will plan to start with breast mammography, likely will also need ultrasound for this too.  - Bilateral mammography ordered - Will need bilateral breast ultrasound as well  Anxiety Patient is seen for this by Dr. Sanjuana Letters with psychiatry. She currently takes clonazepam and gabapentin, both prescribed by her psychiatrist. She sees them every 1-2 months.  - Follow-up with psychiatry  Opioid use disorder Patient reports she snorts heroin roughly twice daily. Per chart review, it appears she has had multiple overdoses in the past with ED visits. She has previously tried suboxone, but states this gave her unwanted side effects. She is planning to get help with an inpatient rehabilitation center in the upcoming weeks. Discussed that we do offer suboxone treatment here, patient declined today.   Tobacco use disorder Patient reports she smokes roughly half pack of cigarettes daily for at least the last 10 years. She does states she would like to quit and is interested in interventions. Discussed different options for this, she would like to proceed with nicotine patches.  - Nicotine patches  ordered  Multiple excoriations Patient reports she has had increase in skin lesions over the last few months. Describes them as painful, but not pruritic. States they initially started on her chest and have slowly spread. Further describes them looking like a pimple, but when she tries to pop them there is white (but she clarifies not purulent) material that comes out and the site then bleeds. She does report repeatedly picking at the lesions frequently. States these lesions are now all over her body, including extremities.  On exam, there are multiple excoriations throughout her body, including scalp, back, chest, arms, legs. I am concerned that her anxiety and amphetamine use for ADHD is contributing to these lesions. They are non-pruritic, are different sizes and are at varying healing stages. We will start with basic lab work and have her return in one month for further evaluation.  - Follow-up in one month - Consider discussing decreasing amphetamine  Normocytic anemia On lab work, patient found to have new normocytic anemia. During out appointment she did report having periods less frequently than previous, but will need to further clarify if she is having any heavy bleeding. In addition, she has had a 25lb unintentional weight loss in last few months. Given  this, I am going to refer her for a colonoscopy for further evaluation. At next visit would also obtain iron studies.  - CBC, iron studies at next visit - GI referral for colonoscopy  Patient discussed with Dr.  Richrd Sox, MD Internal Medicine PGY-3 Pager: [phone]

## 2022-05-11 NOTE — Patient Instructions (Signed)
Ms.Shamina Boise, it was a pleasure seeing you today!  Today we discussed: - Welcome to our clinic! We are happy to have you.  - I am going to get a screening mammogram for the breast lumps. You will be called to schedule this.  - I have lab work to see if we can figure out why you are losing weight and why you are getting the scabs.  - I would like for you to call us with your previous primary care office so we can get records.  - I have sent in a prescription for nicotine patches.  I have ordered the following labs today:   Lab Orders         CMP14 + Anion Gap         CBC no Diff         TSH         HIV antibody (with reflex)         Hepatitis C Ab reflex to Quant PCR      I have ordered the following medication/changed the following medications:   Start the following medications: Meds ordered this encounter  Medications   nicotine (NICODERM CQ - DOSED IN MG/24 HOURS) 14 mg/24hr patch    Sig: Place 1 patch (14 mg total) onto the skin daily.    Dispense:  30 patch    Refill:  0     Follow-up:  1 month    Please make sure to arrive 15 minutes prior to your next appointment. If you arrive late, you may be asked to reschedule.   We look forward to seeing you next time. Please call our clinic at [phone] if you have any questions or concerns. The best time to call is Monday-Friday from 9am-4pm, but there is someone available 24/7. If after hours or the weekend, call the main hospital number and ask for the Internal Medicine Resident On-Call. If you need medication refills, please notify your pharmacy one week in advance and they will send [place] a request.  Thank you for letting us take part in your care. Wishing you the best!  Thank you, Sanjuan Dame, MD

## 2022-05-13 DIAGNOSIS — N6310 Unspecified lump in the right breast, unspecified quadrant: Secondary | ICD-10-CM | POA: Insufficient documentation

## 2022-05-13 DIAGNOSIS — F17200 Nicotine dependence, unspecified, uncomplicated: Secondary | ICD-10-CM | POA: Insufficient documentation

## 2022-05-13 DIAGNOSIS — F419 Anxiety disorder, unspecified: Secondary | ICD-10-CM | POA: Insufficient documentation

## 2022-05-13 DIAGNOSIS — R634 Abnormal weight loss: Secondary | ICD-10-CM | POA: Insufficient documentation

## 2022-05-13 DIAGNOSIS — T07XXXA Unspecified multiple injuries, initial encounter: Secondary | ICD-10-CM | POA: Insufficient documentation

## 2022-05-13 DIAGNOSIS — D649 Anemia, unspecified: Secondary | ICD-10-CM | POA: Insufficient documentation

## 2022-05-13 DIAGNOSIS — F909 Attention-deficit hyperactivity disorder, unspecified type: Secondary | ICD-10-CM | POA: Insufficient documentation

## 2022-05-13 DIAGNOSIS — F1190 Opioid use, unspecified, uncomplicated: Secondary | ICD-10-CM | POA: Insufficient documentation

## 2022-05-13 NOTE — Assessment & Plan Note (Signed)
Patient is presenting today with two breast lumps. Describes a painless lump on her right breast she has noticed for the last few months. She denies any changes with menstrual cycles, nipple changes, discharge, skin changes. She states she has a family history of breast cancer and would like for these to be worked up. On exam today, patient has a lump in each of her breasts, one on the right is mildly tender to palpation. There is no skin or nipple changes or discharge appreciated. No axillary LAD as well. We will plan to start with breast mammography, likely will also need ultrasound for this too.  - Bilateral mammography ordered - Will need bilateral breast ultrasound as well

## 2022-05-13 NOTE — Assessment & Plan Note (Signed)
Patient reports she has had increase in skin lesions over the last few months. Describes them as painful, but not pruritic. States they initially started on her chest and have slowly spread. Further describes them looking like a pimple, but when she tries to pop them there is white (but she clarifies not purulent) material that comes out and the site then bleeds. She does report repeatedly picking at the lesions frequently. States these lesions are now all over her body, including extremities.  On exam, there are multiple excoriations throughout her body, including scalp, back, chest, arms, legs. I am concerned that her anxiety and amphetamine use for ADHD is contributing to these lesions. They are non-pruritic, are different sizes and are at varying healing stages. We will start with basic lab work and have her return in one month for further evaluation.  - Follow-up in one month - Consider discussing decreasing amphetamine

## 2022-05-13 NOTE — Addendum Note (Signed)
Addended bySanjuan Dame on: 05/13/2022 03:28 PM   Modules accepted: Orders

## 2022-05-13 NOTE — Assessment & Plan Note (Signed)
Patient is seen by Dr. Sanjuana Letters, a psychiatrist for this problem. Patient currently takes Adderall 96m XR, prescribed by Dr. ISanjuana Letters On exam, patient does appear quite fidgety and anxious, I am wondering if adderrall is contributing to this. In addition, she has many excoriated lesions throughout that she reports she picks at, which certainly could be worsened with amphetamine use. Will have her continue following up with psychiatry.  - Follow-up with psychiatry

## 2022-05-13 NOTE — Assessment & Plan Note (Signed)
Patient reports history of lupus. I do not see any records or any labs confirming diagnosis. I do see an ANA that was negative in 2013 in [place]. Patient does not recall her previous primary care office, she is to call us when she has that information.   - Follow-up with previous primary care records

## 2022-05-13 NOTE — Assessment & Plan Note (Signed)
BP Readings from Last 3 Encounters:  05/11/22 123/78  12/25/21 104/64  11/29/21 99/78   Blood pressure today at goal, currently not on any antihypertensive. She denies any chest pain, dyspnea, lightheadedness. Will get BMP today.  - BMP today

## 2022-05-13 NOTE — Assessment & Plan Note (Signed)
Patient reports she snorts heroin roughly twice daily. Per chart review, it appears she has had multiple overdoses in the past with ED visits. She has previously tried suboxone, but states this gave her unwanted side effects. She is planning to get help with an inpatient rehabilitation center in the upcoming weeks. Discussed that we do offer suboxone treatment here, patient declined today.

## 2022-05-13 NOTE — Assessment & Plan Note (Signed)
Patient reports roughly 25lb weight loss over the last three months. She states her appetite has remained normal. She was previously unhomed but has been staying with a friend over this time. Mentions her diet has mainly consisted of carbohydrates, such as pasta. She denies any fevers, chills, night sweats, hematochezia, hematemesis, hair/skin changes, heat/cold intolerance.   Unclear on why patient would be losing this weight despite having a normal appetite. She is not currently up to date on cancer screenings, she needs a PAP smear. We are also going to get a mammogram for a few breast lumps. Today's visit was cut short, as she is a new patient and we were unable to fully discuss everything. Today we will obtain blood work and have her return in a month for an hour visit for further evaluation.  - Follow-up CBC, CMP, TSH, HIV, hepatitis C Ab - Needs age appropriate cancer screenings - Return in one month for further evaluation

## 2022-05-13 NOTE — Assessment & Plan Note (Addendum)
On lab work, patient found to have new normocytic anemia. During out appointment she did report having periods less frequently than previous, but will need to further clarify if she is having any heavy bleeding. In addition, she has had a 25lb unintentional weight loss in last few months. Given this, I am going to refer her for a colonoscopy for further evaluation. At next visit would also obtain iron studies.  - CBC, iron studies at next visit - GI referral for colonoscopy

## 2022-05-13 NOTE — Assessment & Plan Note (Signed)
Patient is seen for this by Dr. Sanjuana Letters with psychiatry. She currently takes clonazepam and gabapentin, both prescribed by her psychiatrist. She sees them every 1-2 months.  - Follow-up with psychiatry

## 2022-05-13 NOTE — Assessment & Plan Note (Signed)
Patient reports she smokes roughly half pack of cigarettes daily for at least the last 10 years. She does states she would like to quit and is interested in interventions. Discussed different options for this, she would like to proceed with nicotine patches.  - Nicotine patches ordered

## 2022-05-14 LAB — CBC
Hematocrit: 32.9 % — ABNORMAL LOW (ref 34.0–46.6)
Hemoglobin: 10.7 g/dL — ABNORMAL LOW (ref 11.1–15.9)
MCH: 30.8 pg (ref 26.6–33.0)
MCHC: 32.5 g/dL (ref 31.5–35.7)
MCV: 95 fL (ref 79–97)
Platelets: 238 10*3/uL (ref 150–450)
RBC: 3.47 x10E6/uL — ABNORMAL LOW (ref 3.77–5.28)
RDW: 12.3 % (ref 11.7–15.4)
WBC: 5.8 10*3/uL (ref 3.4–10.8)

## 2022-05-14 LAB — CMP14 + ANION GAP
ALT: 11 IU/L (ref 0–32)
AST: 22 IU/L (ref 0–40)
Albumin/Globulin Ratio: 1.9 (ref 1.2–2.2)
Albumin: 4.3 g/dL (ref 3.9–4.9)
Alkaline Phosphatase: 53 IU/L (ref 44–121)
Anion Gap: 18 mmol/L (ref 10.0–18.0)
BUN/Creatinine Ratio: 22 (ref 9–23)
BUN: 14 mg/dL (ref 6–24)
Bilirubin Total: <0.2 mg/dL (ref 0.0–1.2)
CO2: 21 mmol/L (ref 20–29)
Calcium: 9.4 mg/dL (ref 8.7–10.2)
Chloride: 103 mmol/L (ref 96–106)
Creatinine, Ser: 0.64 mg/dL (ref 0.57–1.00)
Globulin, Total: 2.3 g/dL (ref 1.5–4.5)
Glucose: 112 mg/dL — ABNORMAL HIGH (ref 70–99)
Potassium: 4.1 mmol/L (ref 3.5–5.2)
Sodium: 142 mmol/L (ref 134–144)
Total Protein: 6.6 g/dL (ref 6.0–8.5)
eGFR: 110 mL/min/{1.73_m2} (ref >59)

## 2022-05-14 LAB — HCV AB W REFLEX TO QUANT PCR: HCV Ab: NONREACTIVE

## 2022-05-14 LAB — HCV INTERPRETATION

## 2022-05-14 LAB — TSH: TSH: 2.36 u[IU]/mL (ref 0.450–4.500)

## 2022-05-14 LAB — HIV ANTIBODY (ROUTINE TESTING W REFLEX): HIV Screen 4th Generation wRfx: NONREACTIVE

## 2022-05-20 ENCOUNTER — Other Ambulatory Visit: Payer: Self-pay | Admitting: Student

## 2022-05-20 ENCOUNTER — Encounter: Payer: Self-pay | Admitting: Physician Assistant

## 2022-05-20 DIAGNOSIS — N63 Unspecified lump in unspecified breast: Secondary | ICD-10-CM

## 2022-05-21 NOTE — Progress Notes (Signed)
Internal Medicine Clinic Attending ? ?Case discussed with Dr. Braswell  At the time of the visit.  We reviewed the resident?s history and exam and pertinent patient test results.  I agree with the assessment, diagnosis, and plan of care documented in the resident?s note.  ?

## 2022-05-26 ENCOUNTER — Emergency Department (HOSPITAL_COMMUNITY): Payer: Medicaid Other

## 2022-05-26 ENCOUNTER — Encounter (HOSPITAL_COMMUNITY): Payer: Self-pay

## 2022-05-26 ENCOUNTER — Other Ambulatory Visit: Payer: Self-pay

## 2022-05-26 ENCOUNTER — Emergency Department (HOSPITAL_COMMUNITY)
Admission: EM | Admit: 2022-05-26 | Discharge: 2022-05-26 | Disposition: A | Discharge status: Home / Self Care | Payer: Medicaid Other | Attending: Emergency Medicine | Admitting: Emergency Medicine

## 2022-05-26 DIAGNOSIS — T401X1A Poisoning by heroin, accidental (unintentional), initial encounter: Secondary | ICD-10-CM | POA: Diagnosis present

## 2022-05-26 DIAGNOSIS — E876 Hypokalemia: Secondary | ICD-10-CM | POA: Diagnosis not present

## 2022-05-26 DIAGNOSIS — K5903 Drug induced constipation: Secondary | ICD-10-CM | POA: Insufficient documentation

## 2022-05-26 DIAGNOSIS — I10 Essential (primary) hypertension: Secondary | ICD-10-CM | POA: Insufficient documentation

## 2022-05-26 LAB — ACETAMINOPHEN LEVEL: Acetaminophen (Tylenol), Serum: <10 ug/mL — ABNORMAL LOW (ref 10–30)

## 2022-05-26 LAB — CBC WITH DIFFERENTIAL/PLATELET
Abs Immature Granulocytes: 0.04 10*3/uL (ref 0.00–0.07)
Basophils Absolute: 0 10*3/uL (ref 0.0–0.1)
Basophils Relative: 1 %
Eosinophils Absolute: 0.2 10*3/uL (ref 0.0–0.5)
Eosinophils Relative: 3 %
HCT: 40.3 % (ref 36.0–46.0)
Hemoglobin: 13.2 g/dL (ref 12.0–15.0)
Immature Granulocytes: 1 %
Lymphocytes Relative: 13 %
Lymphs Abs: 0.7 10*3/uL (ref 0.7–4.0)
MCH: 31.5 pg (ref 26.0–34.0)
MCHC: 32.8 g/dL (ref 30.0–36.0)
MCV: 96.2 fL (ref 80.0–100.0)
Monocytes Absolute: 0.6 10*3/uL (ref 0.1–1.0)
Monocytes Relative: 11 %
Neutro Abs: 4 10*3/uL (ref 1.7–7.7)
Neutrophils Relative %: 71 %
Platelets: 249 10*3/uL (ref 150–400)
RBC: 4.19 MIL/uL (ref 3.87–5.11)
RDW: 12.7 % (ref 11.5–15.5)
WBC: 5.5 10*3/uL (ref 4.0–10.5)
nRBC: 0 % (ref 0.0–0.2)

## 2022-05-26 LAB — RAPID URINE DRUG SCREEN, HOSP PERFORMED
Amphetamines: NOT DETECTED
Barbiturates: NOT DETECTED
Benzodiazepines: NOT DETECTED
Cocaine: POSITIVE — AB
Opiates: NOT DETECTED
Tetrahydrocannabinol: NOT DETECTED

## 2022-05-26 LAB — COMPREHENSIVE METABOLIC PANEL
ALT: 13 U/L (ref 0–44)
AST: 28 U/L (ref 15–41)
Albumin: 4 g/dL (ref 3.5–5.0)
Alkaline Phosphatase: 49 U/L (ref 38–126)
Anion gap: 12 (ref 5–15)
BUN: 15 mg/dL (ref 6–20)
CO2: 24 mmol/L (ref 22–32)
Calcium: 8.9 mg/dL (ref 8.9–10.3)
Chloride: 98 mmol/L (ref 98–111)
Creatinine, Ser: 0.88 mg/dL (ref 0.44–1.00)
GFR, Estimated: >60 mL/min (ref >60)
Glucose, Bld: 135 mg/dL — ABNORMAL HIGH (ref 70–99)
Potassium: 3.1 mmol/L — ABNORMAL LOW (ref 3.5–5.1)
Sodium: 134 mmol/L — ABNORMAL LOW (ref 135–145)
Total Bilirubin: 0.6 mg/dL (ref 0.3–1.2)
Total Protein: 6.9 g/dL (ref 6.5–8.1)

## 2022-05-26 LAB — ETHANOL: Alcohol, Ethyl (B): <10 mg/dL (ref <10)

## 2022-05-26 LAB — I-STAT BETA HCG BLOOD, ED (MC, WL, AP ONLY): I-stat hCG, quantitative: <5 m[IU]/mL (ref <5)

## 2022-05-26 LAB — SALICYLATE LEVEL: Salicylate Lvl: <7 mg/dL — ABNORMAL LOW (ref 7.0–30.0)

## 2022-05-26 LAB — CBG MONITORING, ED: Glucose-Capillary: 135 mg/dL — ABNORMAL HIGH (ref 70–99)

## 2022-05-26 MED ORDER — NALOXONE HCL 4 MG/0.1ML NA LIQD: 1.0000 | Freq: Once | NASAL | 0 refills | Status: AC

## 2022-05-26 MED ORDER — NALOXONE HCL 2 MG/2ML IJ SOSY: 2.0000 mg | PREFILLED_SYRINGE | INTRAMUSCULAR | Status: DC | PRN

## 2022-05-26 MED ORDER — POTASSIUM CHLORIDE CRYS ER 20 MEQ PO TBCR
40.0000 meq | EXTENDED_RELEASE_TABLET | Freq: Once | ORAL | Status: AC
  Administered 2022-05-26: 40 meq via ORAL
  Filled 2022-05-26: qty 2

## 2022-05-26 NOTE — ED Provider Notes (Signed)
[facility] Provider Note   CSN: [id number] Arrival date & time: 05/26/22  1042     History  Chief Complaint  Patient presents with   Drug Overdose    Joy Nelson is a 47 y.o. female.   Drug Overdose     47 year old female with medical history significant for hypertension, cerebral aneurysm, GERD, anxiety, depression, Sjogren's disease, fibromyalgia, lupus who presents to the emergency department after a drug overdose.  The patient states that she took a "bump of heroin" intranasally and subsequently became minimally responsive.  EMS arrived due to concern for overdose and administered 2 mg of Narcan with subsequent improvement and patient mentation.  At the patient initially had to be bagged by EMS but after Narcan administration demonstrated improvement.  She states that she does have Narcan at home for use.  She denies any chest pain or shortness of breath.  She arrives somnolent, GCS 13, arousable to verbal stimulation.  She was placed on end-tidal capnography on arrival.  Home Medications Prior to Admission medications   Medication Sig Start Date End Date Taking? Authorizing Provider  amphetamine-dextroamphetamine (ADDERALL XR) 30 MG 24 hr capsule Take 30 mg by mouth daily.    [provider]  clonazePAM (KLONOPIN) 0.5 MG tablet Take 0.5 mg by mouth 2 (two) times daily as needed for anxiety.    [provider]  gabapentin (NEURONTIN) 300 MG capsule Take 300 mg by mouth 3 (three) times daily.    [provider]  lamoTRIgine (LAMICTAL) 200 MG tablet Take 200 mg by mouth daily.    [provider]  lurasidone (LATUDA) 40 MG TABS tablet Take 40 mg by mouth daily with breakfast.    [provider]  nicotine (NICODERM CQ - DOSED IN MG/24 HOURS) 14 mg/24hr patch Place 1 patch (14 mg total) onto the skin daily. 05/11/22 06/10/22  Sanjuan Dame, MD      Allergies    Methotrexate derivatives,  Celecoxib, Morphine and related, Other, Penicillins, Sulfa antibiotics, and Tape    Review of Systems   Review of Systems  All other systems reviewed and are negative.   Physical Exam Updated Vital Signs BP 126/85   Pulse 72   Temp 97.6 F (36.4 C) (Oral)   Resp (!) 22   Ht '5\' 1"'$  (1.549 m)   Wt 52.6 kg   SpO2 100%   BMI 21.92 kg/m  Physical Exam Vitals and nursing note reviewed.  Constitutional:      General: She is not in acute distress.    Appearance: She is well-developed.     Comments: GCS 13, arousable to verbal stimulation  HENT:     Head: Normocephalic and atraumatic.  Eyes:     Conjunctiva/sclera: Conjunctivae normal.  Cardiovascular:     Rate and Rhythm: Normal rate and regular rhythm.  Pulmonary:     Effort: Pulmonary effort is normal. No respiratory distress.     Breath sounds: Normal breath sounds.  Abdominal:     Palpations: Abdomen is soft.     Tenderness: There is no abdominal tenderness.  Musculoskeletal:        General: No swelling.     Cervical back: Neck supple.  Skin:    General: Skin is warm and dry.     Capillary Refill: Capillary refill takes less than 2 seconds.  Neurological:     Mental Status: She is alert.  Psychiatric:        Mood and Affect:  Mood normal.     ED Results / Procedures / Treatments   Labs (all labs ordered are listed, but only abnormal results are displayed) Labs Reviewed  COMPREHENSIVE METABOLIC PANEL - Abnormal; Notable for the following components:      Result Value   Sodium 134 (*)    Potassium 3.1 (*)    Glucose, Bld 135 (*)    All other components within normal limits  SALICYLATE LEVEL - Abnormal; Notable for the following components:   Salicylate Lvl Q000111Q (*)    All other components within normal limits  ACETAMINOPHEN LEVEL - Abnormal; Notable for the following components:   Acetaminophen (Tylenol), Serum <10 (*)    All other components within normal limits  RAPID URINE DRUG SCREEN, HOSP PERFORMED -  Abnormal; Notable for the following components:   Cocaine POSITIVE (*)    All other components within normal limits  CBG MONITORING, ED - Abnormal; Notable for the following components:   Glucose-Capillary 135 (*)    All other components within normal limits  ETHANOL  CBC WITH DIFFERENTIAL/PLATELET  CBG MONITORING, ED  I-STAT BETA HCG BLOOD, ED ([facility], [facility], [facility] ONLY)    EKG EKG Interpretation  Date/Time:  Wednesday May 26 2022 10:51:28 EST Ventricular Rate:  83 PR Interval:  145 QRS Duration: 115 QT Interval:  425 QTC Calculation: 500 R Axis:   -38 Text Interpretation: Sinus rhythm Probable left atrial enlargement Nonspecific IVCD with LAD Left ventricular hypertrophy Confirmed by Regan Lemming ([id number]) on 05/26/2022 11:45:43 AM  Radiology DG Abdomen 1 View  Result Date: 05/26/2022 CLINICAL DATA:  Overdose EXAM: ABDOMEN - 1 VIEW COMPARISON:  None Available. FINDINGS: Nonobstructive bowel gas pattern. Mild-to-moderate colonic stool burden. No radiopaque calculi overlie the kidneys. Right upper quadrant surgical clips. No acute osseous abnormality. IMPRESSION: Nonobstructive bowel gas pattern. Mild-to-moderate colonic stool burden. Electronically Signed   By: Maurine Simmering M.D.   On: 05/26/2022 11:46   DG Chest Portable 1 View  Result Date: 05/26/2022 CLINICAL DATA:  Overdose. EXAM: PORTABLE CHEST 1 VIEW COMPARISON:  11/29/2021 and CT chest 11/29/2021. FINDINGS: Trachea is midline. Heart size stable. Lungs are clear. No pleural fluid. IMPRESSION: No acute findings. Electronically Signed   By: Lorin Picket M.D.   On: 05/26/2022 11:45    Procedures Procedures    Medications Ordered in ED Medications  naloxone (NARCAN) injection 2 mg (has no administration in time range)  potassium chloride SA (KLOR-CON M) CR tablet 40 mEq (40 mEq Oral Given 05/26/22 1301)    ED Course/ Medical Decision Making/ A&P Clinical Course as of 05/26/22 1546  Wed May 26, 2022  1451 COCAINE(!): POSITIVE  [JL]    Clinical Course User Index [JL] Regan Lemming, MD                             Medical Decision Making Amount and/or Complexity of Data Reviewed Labs: ordered. Decision-making details documented in ED Course. Radiology: ordered.  Risk Prescription drug management.    47 year old female with medical history significant for hypertension, cerebral aneurysm, GERD, anxiety, depression, Sjogren's disease, fibromyalgia, lupus who presents to the emergency department after a drug overdose.  The patient states that she took a "bump of heroin" intranasally and subsequently became minimally responsive.  EMS arrived due to concern for overdose and administered 2 mg of Narcan with subsequent improvement and patient mentation.  At the patient initially had to be bagged by EMS but after Narcan administration  demonstrated improvement.  She states that she does have Narcan at home for use.  She denies any chest pain or shortness of breath.  She arrives somnolent, GCS 14, arousable to verbal stimulation.  She was placed on end-tidal capnography on arrival.  The patient's EKG revealed sinus rhythm, ventricular rate 83, no STEMI.  Chest x-ray revealed clear lungs, no pleural effusion, no acute cardiac or pulm abnormality.  An x-ray of the abdomen revealed a nonobstructive bowel gas pattern, mild to moderate colonic stool burden noted.  Laboratory evaluation significant for Tylenol salicylate levels normal, CBG 135, ethanol level normal, beta-hCG negative, CMP with mild hypokalemia to 3.1, replenished orally.  CBC without a leukocytosis or anemia.  No significant electrolyte abnormality beyond mild hypokalemia to 3.1, liver and renal function normal.  The patient was observed on end-tidal capnography for 4 hours in the emergency department. She required no further dosing of Narcan.  Will tentatively plan for discharge with a prescription for Narcan.  Outpatient resources for substance abuse were provided.     On repeat assessment, the patient was still somnolent and requiring observation and monitoring on end-tidal capnography.  Signout given to Dr. Zenia Resides at [phone].   Final Clinical Impression(s) / ED Diagnoses Final diagnoses:  Accidental overdose of heroin, initial encounter ([place])  Drug-induced constipation    Rx / DC Orders ED Discharge Orders     None         Regan Lemming, MD 05/26/22 1546

## 2022-05-26 NOTE — ED Provider Notes (Signed)
Patient signed to me by Dr. Dennis Bast pending patient metabolizing.  Patient feels better at this time.  She is alert and wake.  No acute distress.  No acute psychiatric illness.  Will discharge   Lacretia Leigh, MD 05/26/22 1727

## 2022-05-26 NOTE — ED Notes (Signed)
RN have pt sandwich and sprite.

## 2022-05-26 NOTE — ED Triage Notes (Signed)
Pt BIB EMS due to an OD. Pt was at a friends house and snorted heroin. Pt received '2mg'$  of narcan IM with EMS. Pt was initially being bagged but arrived on room air. Pt is axox4.

## 2022-05-26 NOTE — Discharge Instructions (Addendum)
Mental Health Resources National Suicide Prevention Lifeline 1-800-273-TALK (231) 007-1237) http://www.webster.biz/ "988"-Mental [place] https://sczaction.org/ [place] 432-668-4735 nimhinfo'@nih'$ .gov (e-mail) https://carter.com/ Schizophrenia & Psychosis Action Alliance 267-673-6769 info'@sczaction'$ .org https://sczaction.org/  5 Additional Healthline identified "Best online Schizophrenia Support Groups" Students with Psychosis  Schizophrenia Spectrum Support  Supportiv ($30 monthly fee)   NAMI Connection Recovery Support Group  Schizophrenia Alliance  Local Resources: Outpatient:   [facility] Urgent Care:  [phone] [place], [place]  [address]     [email or web address]   [facility] [address]   [address] [place], [place] [address] [place]. [employer]. [place], [place]  [address] [phone] Local Resources (Inpatient)  [facility] [address] [place], [place] [address] [phone]  [facility] Medicine Unit and Geriatric Psychiatric Unit   [place], [place] [address] [place] TastyShow.cz  [place] https://namiguilford.org/  [employer] and Support Resources:  Partners Ending Homelessness PainGain.tn  Museum/gallery curator https://www.interactiveresourcecenter.org/  [employer] https://www.greensborourbanministry.org/  [facility] [employer] (adult [employer] shelter) www.opendoorministrieshp.org [address] [place], [place] [address] [phone] [employer] of [place] [address] [place], [place] [address] [phone]   [facility] [phone] VET [phone])  [facility] [phone] press 1 Confidential chat-VeteransCrisisLine.net Or Text to [place] Call [phone] or [phone] www.DealerOdds.hu  Affordable Housing Resources in [place].com   [phone]  [place] [phone] 8:30am-5:30pm [place] [facility] [address] [place], [place]  [address] [phone] Female veterans 18+ with substance abuse issues Eligibility:  By Referral Only  [place], [place] [address] [phone] Ext. 48 Adult Men & Women Eligibility: Valid ID & Social Oncologist.greensborourbanministry.[place] [address] [place], [place]  [address] [phone] Female veterans 18+ with substance abuse issues Eligibility:  By Referral Only  [place] [address] [place], [place]  [address] [phone] Single women 18+ without dependents Open 6pm-8am Eligibility:  Valid [place] Card Call to check availability  http://westendministries.org/leslieshouse.aspx  [facility] - [facility] [address] [place], [place]  [address] [phone] Female veterans 18+ with substance abuse/mental health issues Eligibility:  By Referral Only  [facility] [address] [place], [place] [address] [phone] Call to check availability Males 18+ Eligibility: Valid ID & Social Security Card www.odm-hp.[facility] [place], [place] [address] [phone] Women 18+ & Families with children Eligibility:  Valid ID & Criminal  Background Check BattleCheck.dk   Community Care of  (Care Management): [phone] [facility]: [place] 24 Hour Phone Line [place] [phone] [facility] [place] [phone]  2-1-1 Referral Service [facility] 211 Call 211 or [phone] www.DealerOdds.hu A free [employer], 24/7 telephone information and referral service to help link citizens who are seeking help with the community resources they need. In [place], [place], [place] and [place] counties, just dial 211 from your phone. Mental Health Association in [place] Support groups for anxiety, depression and bipolar disorder, schizophrenia,  family and friends, aftermath of suicide, and mental wellness for Latinos (in [place]). Mental Health Association in [place] Offers support groups, [employer] program offers. Psychological, vocational, educational and other rehabilitation services to those who suffer from mental health illness of the 33 and up (5 days a week/5hours a day), offer out patient services like diagnostic evaluation, comprehensive clinical assessments, individual counseling, group therapy, psycho-educational workshops, referral to other specialists, referral to a psychiatrist for an evaluation for medication, consultation, and outreach/training. ADS (Alcohol and Drug Services) ([phone] Substance Abuse education, prevention and treatment (detox, assessments, intensive outpatient and inpatient counseling and programs). [place] [phone], [place] [phone] Support groups for posttraumatic stress, depression, and schizophrenia? as well as day programs for individuals with severe mental illness. [place] [phone] [place] [employer] [phone] or www.kellinfoundation.org Telehealth platform, Individual counseling across the  lifespan for both mental health and substance use, support groups, advocacy, case management, virtual villages, resource coordination. [phone] Monarch-FREE-[phone] or [phone] [phone]. Provides mental health services to all residents regardless of ability to pay. [address] [place]. 24 Domestic Violence Crisis Line Family Service of the [employer] for shelter and/or Electrical engineer [employer] [phone] (24/7) [place] [phone] (24/7)  [place] press 1 Confidential chat-VeteransCrisisLine.net Or Text to [facility] [address] [place], [place] [address] [phone] Hours: Mon-Fri. 8am-5pm Therapeutic Alternatives Mobile Crisis Management Mobile crisis response for mental health, substance abuse or intellectual/developmental disabilities [phone]     Disclaimer: This resource list is subject to change at any time and is a starting point for resource identification as of 03/27/2021.

## 2022-05-31 ENCOUNTER — Ambulatory Visit: Payer: Medicaid Other | Admitting: Family

## 2022-06-23 NOTE — Progress Notes (Deleted)
06/23/2022 Joy Nelson [id number] 11/06/1975  Referring provider: Sid Falcon, MD Primary GI doctor: {acdocs:27040}  ASSESSMENT AND PLAN:   There are no diagnoses linked to this encounter.   Patient Care Team: Sanjuan Dame, MD as PCP - General (Internal Medicine)  HISTORY OF PRESENT ILLNESS: 47 y.o. female with a past medical history of hypertension, cerebral aneurysm, GERD, anxiety, depression, Sjogren's disease, fibromyalgia, lupus and others listed below presents for evaluation of ***.  Patient's had multiple ER visits for opiate and heroin overdoses.   Last seen 05/26/2022 patient was seen for drug-induced constipation and accidental overdose of heroin, responded to Narcan. 05/11/2022 office visit with resident clinic note for weight loss, 25 pounds over 3 months.   Pending mammogram diagnostic. Patient had hemoglobin 10.7, MCV 95, normal WBC and platelet, compared to 6 months ago Hgb 15.  Normal kidney and liver. Hepatitis C and HIV negative, thyroid normal Patient is following with psychiatrist Dr.Izediuno .  She  reports that she has been smoking cigarettes. She has been smoking an average of .25 packs per day. She has never used smokeless tobacco. She reports current alcohol use. She reports current drug use. Frequency: 7.00 times per week. Drug: Marijuana.  RELEVANT LABS AND IMAGING: CBC    Component Value Date/Time   WBC 5.5 05/26/2022 1058   RBC 4.19 05/26/2022 1058   HGB 13.2 05/26/2022 1058   HGB 10.7 (L) 05/11/2022 1646   HCT 40.3 05/26/2022 1058   HCT 32.9 (L) 05/11/2022 1646   PLT 249 05/26/2022 1058   PLT 238 05/11/2022 1646   MCV 96.2 05/26/2022 1058   MCV 95 05/11/2022 1646   MCH 31.5 05/26/2022 1058   MCHC 32.8 05/26/2022 1058   RDW 12.7 05/26/2022 1058   RDW 12.3 05/11/2022 1646   LYMPHSABS 0.7 05/26/2022 1058   MONOABS 0.6 05/26/2022 1058   EOSABS 0.2 05/26/2022 1058   BASOSABS 0.0 05/26/2022 1058   Recent Labs    11/29/21 1541  05/11/22 1646 05/26/22 1058  HGB 15.7* 10.7* 13.2     CMP     Component Value Date/Time   NA 134 (L) 05/26/2022 1058   NA 142 05/11/2022 1646   K 3.1 (L) 05/26/2022 1058   CL 98 05/26/2022 1058   CO2 24 05/26/2022 1058   GLUCOSE 135 (H) 05/26/2022 1058   BUN 15 05/26/2022 1058   BUN 14 05/11/2022 1646   CREATININE 0.88 05/26/2022 1058   CALCIUM 8.9 05/26/2022 1058   PROT 6.9 05/26/2022 1058   PROT 6.6 05/11/2022 1646   ALBUMIN 4.0 05/26/2022 1058   ALBUMIN 4.3 05/11/2022 1646   AST 28 05/26/2022 1058   ALT 13 05/26/2022 1058   ALKPHOS 49 05/26/2022 1058   BILITOT 0.6 05/26/2022 1058   BILITOT <0.2 05/11/2022 1646   GFRNONAA >60 05/26/2022 1058   GFRAA >60 09/12/2018 1800      Latest Ref Rng & Units 05/26/2022   10:58 AM 05/11/2022    4:46 PM 06/07/2020    1:35 PM  Hepatic Function  Total Protein 6.5 - 8.1 g/dL 6.9  6.6  6.4   Albumin 3.5 - 5.0 g/dL 4.0  4.3  4.0   AST 15 - 41 U/L 28  22  21    ALT 0 - 44 U/L 13  11  14    Alk Phosphatase 38 - 126 U/L 49  53  49   Total Bilirubin 0.3 - 1.2 mg/dL 0.6  <0.2  0.7  Current Medications:        Current Outpatient Medications (Other):    amphetamine-dextroamphetamine (ADDERALL XR) 30 MG 24 hr capsule, Take 30 mg by mouth daily.   clonazePAM (KLONOPIN) 0.5 MG tablet, Take 0.5 mg by mouth 2 (two) times daily as needed for anxiety.   gabapentin (NEURONTIN) 300 MG capsule, Take 300 mg by mouth 3 (three) times daily.   lamoTRIgine (LAMICTAL) 200 MG tablet, Take 200 mg by mouth daily.   lurasidone (LATUDA) 40 MG TABS tablet, Take 40 mg by mouth daily with breakfast.  Medical History:  Past Medical History:  Diagnosis Date   ADD (attention deficit disorder)    Anxiety    Back pain    Cerebral aneurysm    Chiari malformation    Depression    Fibromyalgia    GERD (gastroesophageal reflux disease)    Hypertension    Hypoglycemia    Interstitial cystitis    Kidney stones    Lupus (HCC)    Lupus (HCC)     Migraine    Osteoarthritis    Raynaud disease    Seasonal allergies    Sjogren's disease ([place])    Allergies:  Allergies  Allergen Reactions   Methotrexate Derivatives Other (See Comments)    Flushing    Celecoxib Swelling   Morphine And Related Itching   Other     Imitrex   Penicillins Other (See Comments)    Childhood reaction.  Turns blue and has convulsions.   Sulfa Antibiotics Swelling   Tape Rash    Can have paper tape.      Surgical History:  She  has a past surgical history that includes Tonsillectomy; Appendectomy; Cholecystectomy; Tubal ligation; Brain surgery (02/2014); and I & D extremity (Left, 04/22/2016). Family History:  Her family history includes Aneurysm in her father; Arthritis in her maternal grandfather and mother; Breast cancer in her paternal grandmother; Diabetes in her paternal grandmother; Heart disease in her father and mother; Hyperlipidemia in her mother; Hypertension in her father and mother; Migraines in her father and mother; Skin cancer in her maternal grandmother and mother; Stroke in her father and mother.  REVIEW OF SYSTEMS  : All other systems reviewed and negative except where noted in the History of Present Illness.  PHYSICAL EXAM: There were no vitals taken for this visit. General Appearance: Well nourished, in no apparent distress. Head:   Normocephalic and atraumatic. Eyes:  sclerae anicteric,conjunctive pink  Respiratory: Respiratory effort normal, BS equal bilaterally without rales, rhonchi, wheezing. Cardio: RRR with no MRGs. Peripheral pulses intact.  Abdomen: Soft,  {BlankSingle:19197::"Flat","Obese","Non-distended"} ,active bowel sounds. {actendernessAB:27319} tenderness {anatomy; site abdomen:5010}. {BlankMultiple:19196::"Without guarding","With guarding","Without rebound","With rebound"}. No masses. Rectal: {acrectalexam:27461} Musculoskeletal: Full ROM, {PSY - GAIT AND STATION:22860} gait. {With/Without:304960234} edema. Skin:   Dry and intact without significant lesions or rashes Neuro: Alert and  oriented x4;  No focal deficits. Psych:  Cooperative. Normal mood and affect.    Vladimir Crofts, PA-C 1:25 PM

## 2022-06-24 ENCOUNTER — Ambulatory Visit: Payer: Medicaid Other | Admitting: Physician Assistant

## 2022-06-24 DIAGNOSIS — D649 Anemia, unspecified: Secondary | ICD-10-CM

## 2022-06-24 DIAGNOSIS — K5904 Chronic idiopathic constipation: Secondary | ICD-10-CM

## 2022-06-24 DIAGNOSIS — R634 Abnormal weight loss: Secondary | ICD-10-CM

## 2022-06-24 DIAGNOSIS — K219 Gastro-esophageal reflux disease without esophagitis: Secondary | ICD-10-CM

## 2022-06-24 DIAGNOSIS — F1190 Opioid use, unspecified, uncomplicated: Secondary | ICD-10-CM

## 2022-07-05 ENCOUNTER — Inpatient Hospital Stay: Admission: RE | Admit: 2022-07-05 | Payer: Medicaid Other | Source: Ambulatory Visit

## 2022-07-05 ENCOUNTER — Other Ambulatory Visit: Payer: Medicaid Other

## 2022-07-16 DIAGNOSIS — F1120 Opioid dependence, uncomplicated: Secondary | ICD-10-CM | POA: Diagnosis not present

## 2022-07-20 DIAGNOSIS — F1120 Opioid dependence, uncomplicated: Secondary | ICD-10-CM | POA: Diagnosis not present

## 2022-07-23 DIAGNOSIS — F1120 Opioid dependence, uncomplicated: Secondary | ICD-10-CM | POA: Diagnosis not present

## 2022-07-28 DIAGNOSIS — F1120 Opioid dependence, uncomplicated: Secondary | ICD-10-CM | POA: Diagnosis not present

## 2022-07-30 ENCOUNTER — Encounter (HOSPITAL_COMMUNITY): Payer: Self-pay

## 2022-07-30 ENCOUNTER — Ambulatory Visit (HOSPITAL_COMMUNITY)
Admission: EM | Admit: 2022-07-30 | Discharge: 2022-07-30 | Disposition: A | Discharge status: Home / Self Care | Payer: Medicaid Other

## 2022-07-30 DIAGNOSIS — R21 Rash and other nonspecific skin eruption: Secondary | ICD-10-CM | POA: Diagnosis not present

## 2022-07-30 DIAGNOSIS — F17200 Nicotine dependence, unspecified, uncomplicated: Secondary | ICD-10-CM | POA: Diagnosis not present

## 2022-07-30 DIAGNOSIS — I10 Essential (primary) hypertension: Secondary | ICD-10-CM | POA: Diagnosis not present

## 2022-07-30 DIAGNOSIS — J018 Other acute sinusitis: Secondary | ICD-10-CM

## 2022-07-30 LAB — POCT RAPID STREP A (OFFICE): Rapid Strep A Screen: NEGATIVE

## 2022-07-30 MED ORDER — DOXYCYCLINE HYCLATE 100 MG PO CAPS: 100.0000 mg | ORAL_CAPSULE | Freq: Two times a day (BID) | ORAL | 0 refills | Status: DC

## 2022-07-30 MED ORDER — HYDROCHLOROTHIAZIDE 12.5 MG PO TABS: 12.5000 mg | ORAL_TABLET | Freq: Every day | ORAL | 0 refills | Status: AC

## 2022-07-30 MED ORDER — CETIRIZINE HCL 10 MG PO TABS: 10.0000 mg | ORAL_TABLET | Freq: Every day | ORAL | 0 refills | Status: AC

## 2022-07-30 NOTE — Discharge Instructions (Addendum)
Follow up with your PCP to see about a referral to a dermatologist. We will manage this as a sinus infection with doxycycline. For sore throat or cough try using a honey-based tea. Use 3 teaspoons of honey with juice squeezed from half lemon. Place shaved pieces of ginger into 1/2-1 cup of water and warm over stove top. Then mix the ingredients and repeat every 4 hours as needed. Please take Tylenol 500mg -650mg  every 6 hours for throat pain, fevers, aches and pains. Hydrate very well with at least 2 liters of water. Eat light meals such as soups (chicken and noodles, vegetable, chicken and wild rice).  Do not eat foods that you are allergic to.  Taking an antihistamine like Zyrtec can help against postnasal drainage, sinus congestion which can cause sinus pain, sinus headaches, throat pain, painful swallowing, coughing.  You can take this together with pseudoephedrine (Sudafed) at a dose of 60 mg 3 times a day or twice daily as needed for the same kind of nasal drip, congestion.  Use cough medication as needed.   Start hydrochlorothiazide again for your blood pressure.   For diabetes or elevated blood sugar, please make sure you are limiting and avoiding starchy, carbohydrate foods like pasta, breads, sweet breads, pastry, rice, potatoes, desserts. These foods can elevate your blood sugar. Also, limit and avoid drinks that contain a lot of sugar such as sodas, sweet teas, fruit juices.  Drinking plain water will be much more helpful, try 64 ounces of water daily.  It is okay to flavor your water naturally by cutting cucumber, lemon, mint or lime, placing it in a picture with water and drinking it over a period of 24-48 hours as long as it remains refrigerated.  For elevated blood pressure, make sure you are monitoring salt in your diet.  Do not eat restaurant foods and limit processed foods at home. I highly recommend you prepare and cook your own foods at home.  Processed foods include things like frozen  meals, pre-seasoned meats and dinners, deli meats, canned foods as these foods contain a high amount of sodium/salt.  Make sure you are paying attention to sodium labels on foods you buy at the grocery store. Buy your spices separately such as garlic powder, onion powder, cumin, cayenne, parsley flakes so that you can avoid seasonings that contain salt. However, salt-free seasonings are available and can be used, an example is Mrs. Dash and includes a lot of different mixtures that do not contain salt.  Lastly, when cooking using oils that are healthier for you is important. This includes olive oil, avocado oil, canola oil. We have discussed a lot of foods to avoid but below is a list of foods that can be very healthy to use in your diet whether it is for diabetes, cholesterol, high blood pressure, or in general healthy eating.  Salads - kale, spinach, cabbage, spring mix, arugula Fruits - avocadoes, berries (blueberries, raspberries, blackberries), apples, oranges, pomegranate, grapefruit, kiwi Vegetables - asparagus, cauliflower, broccoli, green beans, brussel sprouts, bell peppers, beets; stay away from or limit starchy vegetables like potatoes, carrots, peas Other general foods - kidney beans, egg whites, almonds, walnuts, sunflower seeds, pumpkin seeds, fat free yogurt, almond milk, flax seeds, quinoa, oats  Meat - It is better to eat lean meats and limit your red meat including pork to once a week.  Wild caught fish, chicken breast are good options as they tend to be leaner sources of good protein. Still be mindful of the  sodium labels for the meats you buy.  DO NOT EAT ANY FOODS ON THIS LIST THAT YOU ARE ALLERGIC TO. For more specific needs, I highly recommend consulting a dietician or nutritionist but this can definitely be a good starting point.

## 2022-07-30 NOTE — ED Triage Notes (Signed)
Chief Complaint: Ear Fullness, Sore Throat, Fever, bumps on the arms. States there are some white things in the bumps that she pulls out of the bumps.   Onset: 2 weeks.   Prescriptions or OTC medications tried: Yes- ibuprofen, neosporin    with little relief  Sick exposure: Yes- Husband had a cold a few weeks ago  New foods, medications, or products: No  Recent Travel: No

## 2022-07-30 NOTE — ED Provider Notes (Signed)
[facility]   MRN: [id number] DOB: 06/12/1975  Subjective:   Timea Kozisek is a 47 y.o. female presenting for 2-week history of persistent and worsening throat pain, drainage, fever, bilateral ear fullness and decreased hearing.  She is also had a persistent cough.  No chest pain, shortness of breath or wheezing.  Patient is a smoker.  Also has a history of allergic rhinitis, lupus, Sjogren's disease.  She is a heroin user.  Reports that she has abstained for the past couple of months.  She did have an accidental overdose and was treated through the emergency room 05/26/2022.  Reports that since then she has gotten intermittent lesions over her skin that becomes sores and draining.  They have resolved without issues but continued to happen over different spots on her arms and chest.  She has discussed this with her PCP but would like another recommendation.  No current facility-administered medications for this encounter.  Current Outpatient Medications:    amphetamine-dextroamphetamine (ADDERALL XR) 30 MG 24 hr capsule, Take 30 mg by mouth daily., Disp: , Rfl:    aspirin EC 81 MG tablet, Take 1 tablet by mouth daily., Disp: , Rfl:    gabapentin (NEURONTIN) 300 MG capsule, Take 300 mg by mouth 3 (three) times daily., Disp: , Rfl:    lamoTRIgine (LAMICTAL) 200 MG tablet, Take 200 mg by mouth daily., Disp: , Rfl:    lurasidone (LATUDA) 40 MG TABS tablet, Take 40 mg by mouth daily with breakfast., Disp: , Rfl:    clonazePAM (KLONOPIN) 0.5 MG tablet, Take 0.5 mg by mouth 2 (two) times daily as needed for anxiety., Disp: , Rfl:    Allergies  Allergen Reactions   Methotrexate Derivatives Other (See Comments)    Flushing    Celecoxib Swelling   Morphine And Related Itching   Other     Imitrex   Penicillins Other (See Comments)    Childhood reaction.  Turns blue and has convulsions.   Sulfa Antibiotics Swelling   Tape Rash    Can have paper tape.     Past Medical  History:  Diagnosis Date   ADD (attention deficit disorder)    Anxiety    Back pain    Cerebral aneurysm    Chiari malformation    Depression    Fibromyalgia    GERD (gastroesophageal reflux disease)    Hypertension    Hypoglycemia    Interstitial cystitis    Kidney stones    Lupus (HCC)    Lupus (HCC)    Migraine    Osteoarthritis    Raynaud disease    Seasonal allergies    Sjogren's disease (HCC)      Past Surgical History:  Procedure Laterality Date   APPENDECTOMY     BRAIN SURGERY  02/2014   pipeline stent   CHOLECYSTECTOMY     I & D EXTREMITY Left 04/22/2016   Procedure: IRRIGATION AND DEBRIDEMENT LEFT UPPER ARM POSSIBLE ABCESS;  Surgeon: Luretha Murphy, MD;  Location: [facility];  Service: General;  Laterality: Left;  Wound left open   TONSILLECTOMY     TUBAL LIGATION      Family History  Problem Relation Age of Onset   Hypertension Mother    Stroke Mother    Migraines Mother    Skin cancer Mother    Heart disease Mother    Hyperlipidemia Mother    Arthritis Mother    Hypertension Father    Migraines Father  Heart disease Father    Stroke Father    Aneurysm Father    Skin cancer Maternal Grandmother    Arthritis Maternal Grandfather    Breast cancer Paternal Grandmother    Diabetes Paternal Grandmother     Social History   Tobacco Use   Smoking status: Every Day    Packs/day: .25    Types: Cigarettes   Smokeless tobacco: Never   Tobacco comments:    01/21/16 one pack every 2.5 days  Substance Use Topics   Alcohol use: Yes    Comment: Socially - maybe once monthly   Drug use: Yes    Frequency: 7.0 times per week    Types: Marijuana    Comment: heroin    ROS   Objective:   Vitals: BP (!) 179/109 (BP Location: Left Arm)   Pulse 92   Temp 98.4 F (36.9 C) (Oral)   Resp 18   LMP 05/28/2022 (Approximate)   SpO2 96%   BP Readings from Last 3 Encounters:  07/30/22 (!) 179/109  05/26/22 137/87  05/11/22 123/78   Physical  Exam Constitutional:      General: She is not in acute distress.    Appearance: Normal appearance. She is well-developed and normal weight. She is not ill-appearing, toxic-appearing or diaphoretic.  HENT:     Head: Normocephalic and atraumatic.     Right Ear: Tympanic membrane, ear canal and external ear normal. No drainage or tenderness. No middle ear effusion. There is no impacted cerumen. Tympanic membrane is not erythematous or bulging.     Left Ear: Tympanic membrane, ear canal and external ear normal. No drainage or tenderness.  No middle ear effusion. There is no impacted cerumen. Tympanic membrane is not erythematous or bulging.     Nose: Congestion present. No rhinorrhea.     Mouth/Throat:     Mouth: Mucous membranes are moist. No oral lesions.     Pharynx: Posterior oropharyngeal erythema (with associated thick streaks of postnasal drainage overlying pharynx) present. No pharyngeal swelling, oropharyngeal exudate or uvula swelling.     Tonsils: No tonsillar exudate or tonsillar abscesses.  Eyes:     General: No scleral icterus.       Right eye: No discharge.        Left eye: No discharge.     Extraocular Movements: Extraocular movements intact.     Right eye: Normal extraocular motion.     Left eye: Normal extraocular motion.     Conjunctiva/sclera: Conjunctivae normal.  Cardiovascular:     Rate and Rhythm: Normal rate and regular rhythm.     Heart sounds: Normal heart sounds. No murmur heard.    No friction rub. No gallop.  Pulmonary:     Effort: Pulmonary effort is normal. No respiratory distress.     Breath sounds: No stridor. No wheezing, rhonchi or rales.  Chest:     Chest wall: No tenderness.  Musculoskeletal:     Cervical back: Normal range of motion and neck supple.  Lymphadenopathy:     Cervical: No cervical adenopathy.  Skin:    General: Skin is warm and dry.     Findings: Rash (Multiple resolving sores scattered over the forearms and upper chest) present.   Neurological:     General: No focal deficit present.     Mental Status: She is alert and oriented to person, place, and time.     Cranial Nerves: No cranial nerve deficit.     Motor: No weakness.  Coordination: Coordination normal.     Gait: Gait normal.     Comments: Negative Romberg and pronator drift, no facial asymmetry.  Psychiatric:        Mood and Affect: Mood normal.        Behavior: Behavior normal.     Results for orders placed or performed during the hospital encounter of 07/30/22 (from the past 24 hour(s))  POC rapid strep A     Status: None   Collection Time: 07/30/22 11:51 AM  Result Value Ref Range   Rapid Strep A Screen Negative Negative    Assessment and Plan :   PDMP not reviewed this encounter.  1. Other acute sinusitis, recurrence not specified   2. Rash and nonspecific skin eruption   3. Smoker   4. Essential hypertension    Given her medication allergies will manage for sinusitis with doxycycline.  There are no signs of otitis media, will defer strep culture.  As patient is a smoker, recommended an allergy regimen daily.  Patient also has high blood pressure that she would like to have managed.  She previously took hydrochlorothiazide and I was agreeable to prescribing this again.  Emphasized hypertensive friendly diet and follow-up with her PCP.  Regarding skin lesions, recommend requesting a referral to dermatology from her PCP. Counseled patient on potential for adverse effects with medications prescribed/recommended today, ER and return-to-clinic precautions discussed, patient verbalized understanding.    [place]is B[place], [place] 07/30/22 1327

## 2022-08-02 DIAGNOSIS — F1120 Opioid dependence, uncomplicated: Secondary | ICD-10-CM | POA: Diagnosis not present

## 2022-08-04 DIAGNOSIS — F1120 Opioid dependence, uncomplicated: Secondary | ICD-10-CM | POA: Diagnosis not present

## 2022-08-09 DIAGNOSIS — F1120 Opioid dependence, uncomplicated: Secondary | ICD-10-CM | POA: Diagnosis not present

## 2022-08-10 DIAGNOSIS — F1120 Opioid dependence, uncomplicated: Secondary | ICD-10-CM | POA: Diagnosis not present

## 2022-08-12 DIAGNOSIS — A5149 Other secondary syphilitic conditions: Secondary | ICD-10-CM | POA: Diagnosis not present

## 2022-08-12 DIAGNOSIS — Z114 Encounter for screening for human immunodeficiency virus [HIV]: Secondary | ICD-10-CM | POA: Diagnosis not present

## 2022-08-12 DIAGNOSIS — N760 Acute vaginitis: Secondary | ICD-10-CM | POA: Diagnosis not present

## 2022-08-12 DIAGNOSIS — Z113 Encounter for screening for infections with a predominantly sexual mode of transmission: Secondary | ICD-10-CM | POA: Diagnosis not present

## 2022-08-12 NOTE — Progress Notes (Signed)
08/16/2022 Joy Nelson [id number] April 10, 1975  Referring provider: Evlyn Kanner, MD Primary GI doctor: Dr. Barron Alvine  ASSESSMENT AND PLAN:   Normocytic anemia in patient with history of polysubstance abuse Will check iron, ferritin and B12, very concern for possible nutritional component Patient is also having symptoms of reflux, dysphagia abdominal discomfort and change in bowel habits so scheduled for EGD and colonoscopy at the same time. Patient appropriate for LEC Risk of bowel prep, conscious sedation, and EGD and colonoscopy were discussed.  Risks include but are not limited to dehydration, pain, bleeding, cardiopulmonary process, bowel perforation, or other possible adverse outcomes..  Treatment plan was discussed with patient, and agreed upon.  Reflux with intermittent dysphagia Possibly from narcotic use/GES, but with history of cocaine/heroin/NSAIDs/smoking history we will plan for upper endoscopy to evaluate for EOE, esophagitis, gastritis, H. pylori, peptic ulcer disease. Started on pantoprazole 40 mg once daily Check celiac Patient's not had any drug use since March, advised to quit smoking.  Left upper abdominal discomfort on exam Question splenomegaly on physical exam with discomfort Will get abdominal ultrasound complete  Right lower quadrant abdominal pain Possible component of constipation, get on MiraLAX daily. Will get abdominal ultrasound. Will plan for colonoscopy.  Weight loss Negative HIV, hepatitis panel Pending diagnostic mammograms Will get abdominal ultrasound Will plan for EGD colonoscopy to evaluate May need further nutritional labs pending iron, ferritin, B12 workup. Check celiac  History of polysubstance abuse On methadone, has been clean since March Encourage patient to continue following up with methadone clinic and [facility]. Discussed that unable to proceed with procedures if she does any drug use.  She understands  Patient Care  Team: Evlyn Kanner, MD as PCP - General (Internal Medicine)  HISTORY OF PRESENT ILLNESS: 47 y.o. female with a past medical history of Chari malformation, FM, depression, GERD, hypertension, Raynaud's, Sjogren's, polysubstance abuse with cocaine, opioids and others listed below presents for evaluation of anemia and weight loss.   Patient reports family history of colon cancer paternal GF, unknown age, no other history. Mother with IBS and ulcers in her stomach.   Patient established with PCP  2/13, complaining of 25 pound unintentional weight loss.  She is on Adderall 30 mg XR Patient admits to cocaine use Patient had negative HIV, hepatitis C normal thyroid Hgb 10.7, MCV 95-normocytic anemia, no iron or ferritin done. Normal kidney liver. 05/26/2022 ER visit for heroin overdose, response to Narcan.  She states she use to be more constipation, change in her Bm's with having them daily to every other day, still hard but can be mushy.  Patient denies hematochezia.  She has RLQ AB pain, crampy pain, can be better after BM.  She has had a lot of gas/bloating. She has had 25 lbs unintentional weight loss, states appetite is fine.   Patient reports GERD, has always had but has been more consistent/every day.  No fever, no chills.  Has had some intermittent dysphagia.  She has nausea but no vomiting.  She has seen melena several months ago, she was not on iron/pepto.  She states she can not handle iron supplements.  She has MGM getting scheduled States she had colon/EGD in her 73's at [facility]  She denies blood thinner use.  She reports NSAID use, ibuprofen 800mg  twice a week.  She reports ETOH use, once every 3 months.  She reports tobacco use, 1/2 pack for last 10 years.  She goes to [facility] and has been off heroin and drugs since  March 28th, she is on methadone.  Has been on doxycydline for sinus infection, still on it.   She  reports that she has been smoking cigarettes. She has been  smoking an average of .25 packs per day. She has never used smokeless tobacco. She reports current alcohol use. She reports current drug use. Frequency: 7.00 times per week. Drug: Marijuana.  RELEVANT LABS AND IMAGING: CBC    Component Value Date/Time   WBC 5.5 05/26/2022 1058   RBC 4.19 05/26/2022 1058   HGB 13.2 05/26/2022 1058   HGB 10.7 (L) 05/11/2022 1646   HCT 40.3 05/26/2022 1058   HCT 32.9 (L) 05/11/2022 1646   PLT 249 05/26/2022 1058   PLT 238 05/11/2022 1646   MCV 96.2 05/26/2022 1058   MCV 95 05/11/2022 1646   MCH 31.5 05/26/2022 1058   MCHC 32.8 05/26/2022 1058   RDW 12.7 05/26/2022 1058   RDW 12.3 05/11/2022 1646   LYMPHSABS 0.7 05/26/2022 1058   MONOABS 0.6 05/26/2022 1058   EOSABS 0.2 05/26/2022 1058   BASOSABS 0.0 05/26/2022 1058   Recent Labs    11/29/21 1541 05/11/22 1646 05/26/22 1058  HGB 15.7* 10.7* 13.2    CMP     Component Value Date/Time   NA 134 (L) 05/26/2022 1058   NA 142 05/11/2022 1646   K 3.1 (L) 05/26/2022 1058   CL 98 05/26/2022 1058   CO2 24 05/26/2022 1058   GLUCOSE 135 (H) 05/26/2022 1058   BUN 15 05/26/2022 1058   BUN 14 05/11/2022 1646   CREATININE 0.88 05/26/2022 1058   CALCIUM 8.9 05/26/2022 1058   PROT 6.9 05/26/2022 1058   PROT 6.6 05/11/2022 1646   ALBUMIN 4.0 05/26/2022 1058   ALBUMIN 4.3 05/11/2022 1646   AST 28 05/26/2022 1058   ALT 13 05/26/2022 1058   ALKPHOS 49 05/26/2022 1058   BILITOT 0.6 05/26/2022 1058   BILITOT <0.2 05/11/2022 1646   GFRNONAA >60 05/26/2022 1058   GFRAA >60 09/12/2018 1800      Latest Ref Rng & Units 05/26/2022   10:58 AM 05/11/2022    4:46 PM 06/07/2020    1:35 PM  Hepatic Function  Total Protein 6.5 - 8.1 g/dL 6.9  6.6  6.4   Albumin 3.5 - 5.0 g/dL 4.0  4.3  4.0   AST 15 - 41 U/L 28  22  21    ALT 0 - 44 U/L 13  11  14    Alk Phosphatase 38 - 126 U/L 49  53  49   Total Bilirubin 0.3 - 1.2 mg/dL 0.6  <7.8  0.7       Current Medications:    Current Outpatient Medications  (Cardiovascular):    hydrochlorothiazide (HYDRODIURIL) 12.5 MG tablet, Take 1 tablet (12.5 mg total) by mouth daily.  Current Outpatient Medications (Respiratory):    cetirizine (ZYRTEC ALLERGY) 10 MG tablet, Take 1 tablet (10 mg total) by mouth daily. (Patient not taking: Reported on 08/16/2022)  Current Outpatient Medications (Analgesics):    methadone (DOLOPHINE) 10 MG/ML solution, Take 20 mg by mouth every 8 (eight) hours.   aspirin EC 81 MG tablet, Take 1 tablet by mouth daily. (Patient not taking: Reported on 08/16/2022)   Current Outpatient Medications (Other):    amphetamine-dextroamphetamine (ADDERALL XR) 30 MG 24 hr capsule, Take 30 mg by mouth daily.   doxycycline (VIBRAMYCIN) 100 MG capsule, Take 1 capsule (100 mg total) by mouth 2 (two) times daily.   lamoTRIgine (LAMICTAL) 200 MG tablet, Take  200 mg by mouth daily.   lurasidone (LATUDA) 40 MG TABS tablet, Take 40 mg by mouth daily with breakfast.   metroNIDAZOLE (FLAGYL) 500 MG tablet, Take 500 mg by mouth 3 (three) times daily.   Na Sulfate-K Sulfate-Mg Sulf 17.5-3.13-1.6 GM/177ML SOLN, Take 1 kit by mouth once for 1 dose.   clonazePAM (KLONOPIN) 0.5 MG tablet, Take 0.5 mg by mouth 2 (two) times daily as needed for anxiety. (Patient not taking: Reported on 08/16/2022)   gabapentin (NEURONTIN) 300 MG capsule, Take 300 mg by mouth 3 (three) times daily. (Patient not taking: Reported on 08/16/2022)  Medical History:  Past Medical History:  Diagnosis Date   ADD (attention deficit disorder)    Anxiety    Back pain    Cerebral aneurysm    Chiari malformation    Depression    Fibromyalgia    GERD (gastroesophageal reflux disease)    Hypertension    Hypoglycemia    Interstitial cystitis    Kidney stones    Lupus (HCC)    Lupus (HCC)    Migraine    Osteoarthritis    Raynaud disease    Seasonal allergies    Sjogren's disease (HCC)    Allergies:  Allergies  Allergen Reactions   Methotrexate Derivatives Other (See  Comments)    Flushing    Celecoxib Swelling   Morphine And Codeine Itching   Other     Imitrex   Penicillins Other (See Comments)    Childhood reaction.  Turns blue and has convulsions.   Sulfa Antibiotics Swelling   Tape Rash    Can have paper tape.      Surgical History:  She  has a past surgical history that includes Tonsillectomy; Appendectomy; Cholecystectomy; Tubal ligation; Brain surgery (02/2014); and I & D extremity (Left, 04/22/2016). Family History:  Her family history includes Aneurysm in her father; Arthritis in her maternal grandfather and mother; Breast cancer in her paternal grandmother; Colon cancer in her maternal grandfather; Diabetes in her paternal grandmother; Heart disease in her father and mother; Hyperlipidemia in her mother; Hypertension in her father and mother; Migraines in her father and mother; Skin cancer in her maternal grandmother and mother; Stroke in her father and mother.  REVIEW OF SYSTEMS  : All other systems reviewed and negative except where noted in the History of Present Illness.  PHYSICAL EXAM: BP 118/62   Pulse 68   Ht 5\' 1"  (1.549 m)   Wt 113 lb (51.3 kg)   LMP 05/28/2022 (Approximate)   BMI 21.35 kg/m  General Appearance: Thin appearing female, in no apparent distress. Head:   Normocephalic and atraumatic. Eyes:  sclerae anicteric,conjunctive pink  Respiratory: Respiratory effort normal, BS equal bilaterally without rales, rhonchi, wheezing. Cardio: RRR with no MRGs. Peripheral pulses intact.  Abdomen: Soft,  Non-distended ,active bowel sounds. mild tenderness in the RLQ and in the LUQ. Without guarding and Without rebound.  Questionable splenomegaly Rectal: Not evaluated Musculoskeletal: Full ROM, Normal gait. Without edema. Skin: rash along bilateral arms/neck, macular/papular with excoriations. No tunnling, no evidence of scabes Neuro: Alert and  oriented x4;  No focal deficits. Psych:  Cooperative. Normal mood and affect.     Doree Albee, PA-C 12:06 PM

## 2022-08-16 ENCOUNTER — Other Ambulatory Visit (INDEPENDENT_AMBULATORY_CARE_PROVIDER_SITE_OTHER): Payer: BC Managed Care – PPO

## 2022-08-16 ENCOUNTER — Ambulatory Visit (INDEPENDENT_AMBULATORY_CARE_PROVIDER_SITE_OTHER): Payer: BC Managed Care – PPO | Admitting: Physician Assistant

## 2022-08-16 ENCOUNTER — Encounter: Payer: Self-pay | Admitting: Physician Assistant

## 2022-08-16 VITALS — BP 118/62 | HR 68 | Ht 61.0 in | Wt 113.0 lb

## 2022-08-16 DIAGNOSIS — D649 Anemia, unspecified: Secondary | ICD-10-CM | POA: Diagnosis not present

## 2022-08-16 DIAGNOSIS — K219 Gastro-esophageal reflux disease without esophagitis: Secondary | ICD-10-CM | POA: Diagnosis not present

## 2022-08-16 DIAGNOSIS — R1319 Other dysphagia: Secondary | ICD-10-CM | POA: Diagnosis not present

## 2022-08-16 DIAGNOSIS — F1910 Other psychoactive substance abuse, uncomplicated: Secondary | ICD-10-CM

## 2022-08-16 DIAGNOSIS — F3162 Bipolar disorder, current episode mixed, moderate: Secondary | ICD-10-CM | POA: Diagnosis not present

## 2022-08-16 DIAGNOSIS — F1110 Opioid abuse, uncomplicated: Secondary | ICD-10-CM | POA: Diagnosis not present

## 2022-08-16 DIAGNOSIS — R1012 Left upper quadrant pain: Secondary | ICD-10-CM | POA: Diagnosis not present

## 2022-08-16 DIAGNOSIS — R1031 Right lower quadrant pain: Secondary | ICD-10-CM | POA: Diagnosis not present

## 2022-08-16 DIAGNOSIS — G8929 Other chronic pain: Secondary | ICD-10-CM

## 2022-08-16 DIAGNOSIS — F908 Attention-deficit hyperactivity disorder, other type: Secondary | ICD-10-CM | POA: Diagnosis not present

## 2022-08-16 LAB — CBC WITH DIFFERENTIAL/PLATELET
Basophils Absolute: 0 10*3/uL (ref 0.0–0.1)
Basophils Relative: 0.8 % (ref 0.0–3.0)
Eosinophils Absolute: 0.2 10*3/uL (ref 0.0–0.7)
Eosinophils Relative: 3.4 % (ref 0.0–5.0)
HCT: 35.5 % — ABNORMAL LOW (ref 36.0–46.0)
Hemoglobin: 12 g/dL (ref 12.0–15.0)
Lymphocytes Relative: 20 % (ref 12.0–46.0)
Lymphs Abs: 1.2 10*3/uL (ref 0.7–4.0)
MCHC: 33.9 g/dL (ref 30.0–36.0)
MCV: 92.7 fl (ref 78.0–100.0)
Monocytes Absolute: 0.5 10*3/uL (ref 0.1–1.0)
Monocytes Relative: 7.9 % (ref 3.0–12.0)
Neutro Abs: 4.2 10*3/uL (ref 1.4–7.7)
Neutrophils Relative %: 67.9 % (ref 43.0–77.0)
Platelets: 275 10*3/uL (ref 150.0–400.0)
RBC: 3.82 Mil/uL — ABNORMAL LOW (ref 3.87–5.11)
RDW: 13.2 % (ref 11.5–15.5)
WBC: 6.1 10*3/uL (ref 4.0–10.5)

## 2022-08-16 LAB — COMPREHENSIVE METABOLIC PANEL
ALT: 9 U/L (ref 0–35)
AST: 18 U/L (ref 0–37)
Albumin: 3.9 g/dL (ref 3.5–5.2)
Alkaline Phosphatase: 54 U/L (ref 39–117)
BUN: 21 mg/dL (ref 6–23)
CO2: 29 mEq/L (ref 19–32)
Calcium: 9.2 mg/dL (ref 8.4–10.5)
Chloride: 101 mEq/L (ref 96–112)
Creatinine, Ser: 0.72 mg/dL (ref 0.40–1.20)
GFR: 99.82 mL/min (ref >60.00)
Glucose, Bld: 93 mg/dL (ref 70–99)
Potassium: 3.9 mEq/L (ref 3.5–5.1)
Sodium: 139 mEq/L (ref 135–145)
Total Bilirubin: 0.4 mg/dL (ref 0.2–1.2)
Total Protein: 6.8 g/dL (ref 6.0–8.3)

## 2022-08-16 MED ORDER — NA SULFATE-K SULFATE-MG SULF 17.5-3.13-1.6 GM/177ML PO SOLN: 1.0000 | Freq: Once | ORAL | 0 refills | Status: AC

## 2022-08-16 NOTE — Patient Instructions (Addendum)
Your provider has requested that you go to the basement level for lab work before leaving today. Press "B" on the elevator. The lab is located at the first door on the left as you exit the elevator.   You have been scheduled for an abdominal ultrasound at [facility] (1st floor of hospital) on 08/24/2022 at 8:00am. Please arrive 30 minutes prior to your appointment for registration. Make certain not to have anything to eat or drink 6 hours prior to your appointment. Should you need to reschedule your appointment, please contact radiology at [phone]. This test typically takes about 30 minutes to perform.  You have been scheduled for a colonoscopy. Please follow written instructions given to you at your visit today.  Please pick up your prep supplies at the pharmacy within the next 1-3 days. If you use inhalers (even only as needed), please bring them with you on the day of your procedure.   Please take your proton pump inhibitor medication, pepcid  Please take this medication 30 minutes to 1 hour before meals- this makes it more effective.  Avoid spicy and acidic foods Avoid fatty foods Limit your intake of coffee, tea, alcohol, and carbonated drinks Work to maintain a healthy weight Keep the head of the bed elevated at least 3 inches with blocks or a wedge pillow if you are having any nighttime symptoms Stay upright for 2 hours after eating Avoid meals and snacks three to four hours before bedtime Stop smoking  _______________________________________________________  If your blood pressure at your visit was 140/90 or greater, please contact your primary care physician to follow up on this.  _______________________________________________________  If you are age 65 or older, your body mass index should be between 23-30. Your Body mass index is 21.35 kg/m. If this is out of the aforementioned range listed, please consider follow up with your Primary Care Provider.  If you  are age 43 or younger, your body mass index should be between 19-25. Your Body mass index is 21.35 kg/m. If this is out of the aformentioned range listed, please consider follow up with your Primary Care Provider.   ________________________________________________________  The [facility] providers would like to encourage you to use [facility] to communicate with providers for non-urgent requests or questions.  Due to long hold times on the telephone, sending your provider a message by [facility] may be a faster and more efficient way to get a response.  Please allow 48 business hours for a response.  Please remember that this is for non-urgent requests.  _______________________________________________________ It was a pleasure to see you today!  Thank you for trusting me with your gastrointestinal care!

## 2022-08-17 DIAGNOSIS — F1120 Opioid dependence, uncomplicated: Secondary | ICD-10-CM | POA: Diagnosis not present

## 2022-08-17 LAB — IBC + FERRITIN
Ferritin: 34.1 ng/mL (ref 10.0–291.0)
Iron: 177 ug/dL — ABNORMAL HIGH (ref 42–145)
Saturation Ratios: 61.7 % — ABNORMAL HIGH (ref 20.0–50.0)
TIBC: 287 ug/dL (ref 250.0–450.0)
Transferrin: 205 mg/dL — ABNORMAL LOW (ref 212.0–360.0)

## 2022-08-17 LAB — IGA: Immunoglobulin A: 86 mg/dL (ref 47–310)

## 2022-08-17 LAB — TISSUE TRANSGLUTAMINASE, IGA: (tTG) Ab, IgA: <1 U/mL

## 2022-08-17 LAB — VITAMIN B12: Vitamin B-12: 355 pg/mL (ref 211–911)

## 2022-08-17 NOTE — Progress Notes (Signed)
Agree with the assessment and plan as outlined by Quentin Mulling, PA-C.  Patient requires EGD/colonoscopy for evaluation of multiple GI symptoms, anemia, and unintentional weight loss.  However, given her polysubstance abuse and Chiari malformation, I would like [facility] to also review her case for appropriateness of LEC vs deferring procedures to be done in the hospital setting.  Cainen Burnham, DO, [facility]

## 2022-08-20 ENCOUNTER — Encounter: Payer: BC Managed Care – PPO | Admitting: Student

## 2022-08-20 NOTE — Progress Notes (Deleted)
CC: ***  HPI:  [name] is a 47 y.o. female with a past medical history of hypertension, migraines, ADHD, anxiety, OUD on methadone who presents for referral to cardiology.  Unclear why at this time.  Will obtain more information from patient.  Care gaps: Pap smear  Patient had recent visit with gastroenterology on 07/29/2022.  Will get EGD and colonoscopy.  Plan to check labs at that time, and they showed decreased ferritin, and they want to recheck iron.  Otherwise normal exams and lab findings.  They want to do a LEC, but wonder if this is appropriate or not.  I wonder if this is why she needs a cardiology referral.  Patient has been having weight loss since February.  Most recent labs: 08/16/2022 Hemoglobin 12, MCV 92.7, platelets 275 CMP: Sodium 139, potassium 3.9, creatinine 0.72 Iron 177, transferrin 205, ferritin 34.1 Vitamin B12 355 Past Medical History:  Diagnosis Date   ADD (attention deficit disorder)    Anxiety    Back pain    Cerebral aneurysm    Chiari malformation    Depression    Fibromyalgia    GERD (gastroesophageal reflux disease)    Hypertension    Hypoglycemia    Interstitial cystitis    Kidney stones    Lupus (HCC)    Lupus (HCC)    Migraine    Osteoarthritis    Raynaud disease    Seasonal allergies    Sjogren's disease (HCC)      Current Outpatient Medications:    amphetamine-dextroamphetamine (ADDERALL XR) 30 MG 24 hr capsule, Take 30 mg by mouth daily., Disp: , Rfl:    aspirin EC 81 MG tablet, Take 1 tablet by mouth daily. (Patient not taking: Reported on 08/16/2022), Disp: , Rfl:    cetirizine (ZYRTEC ALLERGY) 10 MG tablet, Take 1 tablet (10 mg total) by mouth daily. (Patient not taking: Reported on 08/16/2022), Disp: 90 tablet, Rfl: 0   clonazePAM (KLONOPIN) 0.5 MG tablet, Take 0.5 mg by mouth 2 (two) times daily as needed for anxiety. (Patient not taking: Reported on 08/16/2022), Disp: , Rfl:    doxycycline (VIBRAMYCIN) 100 MG  capsule, Take 1 capsule (100 mg total) by mouth 2 (two) times daily., Disp: 20 capsule, Rfl: 0   gabapentin (NEURONTIN) 300 MG capsule, Take 300 mg by mouth 3 (three) times daily. (Patient not taking: Reported on 08/16/2022), Disp: , Rfl:    hydrochlorothiazide (HYDRODIURIL) 12.5 MG tablet, Take 1 tablet (12.5 mg total) by mouth daily., Disp: 90 tablet, Rfl: 0   lamoTRIgine (LAMICTAL) 200 MG tablet, Take 200 mg by mouth daily., Disp: , Rfl:    lurasidone (LATUDA) 40 MG TABS tablet, Take 40 mg by mouth daily with breakfast., Disp: , Rfl:    methadone (DOLOPHINE) 10 MG/ML solution, Take 20 mg by mouth every 8 (eight) hours., Disp: , Rfl:    metroNIDAZOLE (FLAGYL) 500 MG tablet, Take 500 mg by mouth 3 (three) times daily., Disp: , Rfl:   Review of Systems:  ***  Constitutional: Eye: Respiratory: Cardiovascular: GI: MSK: GU: Skin: Neuro: Endocrine:   Physical Exam:  There were no vitals filed for this visit. *** General: Patient is sitting comfortably in the room  Eyes: Pupils equal and reactive to light, EOM intact  Head: Normocephalic, atraumatic  Neck: Supple, nontender, full range of motion, No JVD Cardio: Regular rate and rhythm, no murmurs, rubs or gallops. 2+ pulses to bilateral upper and lower extremities  Chest: No chest tenderness Pulmonary: Clear to  ausculation bilaterally with no rales, rhonchi, and crackles  Abdomen: Soft, nontender with normoactive bowel sounds with no rebound or guarding  Neuro: Alert and orientated x3. CN II-XII intact. Sensation intact to upper and lower extremities. 2+ patellar reflex.  Back: No midline tenderness, no step off or deformities noted. No paraspinal muscle tenderness.  Skin: No rashes noted  MSK: 5/5 strength to upper and lower extremities.    Assessment & Plan:   No problem-specific Assessment & Plan notes found for this encounter.    Patient {GC/GE:[id number]::"discussed with","seen with"} Dr.  {NAMES:[id number]::"Guilloud","Hoffman","Mullen","Narendra","Williams","Vincent"}  Modena Slater, DO PGY-1 Internal Medicine Resident  Pager: [phone]

## 2022-08-24 ENCOUNTER — Telehealth: Payer: Self-pay | Admitting: Physician Assistant

## 2022-08-24 ENCOUNTER — Ambulatory Visit (HOSPITAL_COMMUNITY)
Admission: RE | Admit: 2022-08-24 | Discharge: 2022-08-24 | Disposition: A | Discharge status: Home / Self Care | Payer: BC Managed Care – PPO | Source: Ambulatory Visit | Attending: Physician Assistant | Admitting: Physician Assistant

## 2022-08-24 DIAGNOSIS — D649 Anemia, unspecified: Secondary | ICD-10-CM | POA: Diagnosis not present

## 2022-08-24 DIAGNOSIS — R1010 Upper abdominal pain, unspecified: Secondary | ICD-10-CM | POA: Diagnosis not present

## 2022-08-24 DIAGNOSIS — F1120 Opioid dependence, uncomplicated: Secondary | ICD-10-CM | POA: Diagnosis not present

## 2022-08-24 NOTE — Telephone Encounter (Signed)
Patient called requesting to speak with a nurse regarding [place] results.

## 2022-08-25 ENCOUNTER — Telehealth: Payer: Self-pay

## 2022-08-25 DIAGNOSIS — F1120 Opioid dependence, uncomplicated: Secondary | ICD-10-CM | POA: Diagnosis not present

## 2022-08-25 NOTE — Telephone Encounter (Signed)
Pt calling for US results, please advise

## 2022-08-25 NOTE — Telephone Encounter (Signed)
-----   Message from Shellia Cleverly, DO sent at 08/25/2022  7:40 AM EDT ----- Thanks Jonny Ruiz. I thought that may be the case with Chiari malformation patients. Appreciate you reviewing the case.   Nashae Maudlin, can you please assist in scheduling EGD/Colonoscopy at [facility] Endo unit. Thanks.   VC  ----- Message ----- From: Cathlyn Parsons, CRNA Sent: 08/17/2022  11:46 AM EDT To: Doree Albee, PA-C; Shellia Cleverly, DO  Dr. Barron Alvine,  Her dx of [address] Chiari malformation makes her high risk for difficult intubation; her procedures will need to be done at the hospital.  Best regards,  Cathlyn Parsons ----- Message ----- From: Shellia Cleverly, DO Sent: 08/17/2022   8:20 AM EDT To: Cathlyn Parsons, CRNA; Doree Albee, PA-C     ----- Message ----- From: Javier Glazier Sent: 08/16/2022  12:14 PM EDT To: Shellia Cleverly, DO

## 2022-08-25 NOTE — Telephone Encounter (Signed)
Joy Nelson, please see note below. LEC procedures need to be cancelled and patient should be scheduled at [facility] at Dr. Frankey Shown next available. Thanks

## 2022-08-25 NOTE — Telephone Encounter (Signed)
Noted  

## 2022-08-27 ENCOUNTER — Other Ambulatory Visit: Payer: Self-pay

## 2022-08-27 DIAGNOSIS — R1319 Other dysphagia: Secondary | ICD-10-CM

## 2022-08-27 DIAGNOSIS — D649 Anemia, unspecified: Secondary | ICD-10-CM

## 2022-08-27 DIAGNOSIS — K219 Gastro-esophageal reflux disease without esophagitis: Secondary | ICD-10-CM

## 2022-08-27 DIAGNOSIS — G8929 Other chronic pain: Secondary | ICD-10-CM

## 2022-08-30 DIAGNOSIS — F1120 Opioid dependence, uncomplicated: Secondary | ICD-10-CM | POA: Diagnosis not present

## 2022-09-01 DIAGNOSIS — F1120 Opioid dependence, uncomplicated: Secondary | ICD-10-CM | POA: Diagnosis not present

## 2022-09-06 DIAGNOSIS — F1120 Opioid dependence, uncomplicated: Secondary | ICD-10-CM | POA: Diagnosis not present

## 2022-09-08 DIAGNOSIS — F1120 Opioid dependence, uncomplicated: Secondary | ICD-10-CM | POA: Diagnosis not present

## 2022-09-15 ENCOUNTER — Ambulatory Visit
Admission: RE | Admit: 2022-09-15 | Discharge: 2022-09-15 | Disposition: A | Discharge status: Home / Self Care | Payer: Medicaid Other | Source: Ambulatory Visit | Attending: Internal Medicine | Admitting: Internal Medicine

## 2022-09-15 DIAGNOSIS — N63 Unspecified lump in unspecified breast: Secondary | ICD-10-CM

## 2022-09-15 DIAGNOSIS — N6321 Unspecified lump in the left breast, upper outer quadrant: Secondary | ICD-10-CM | POA: Diagnosis not present

## 2022-09-15 DIAGNOSIS — N6314 Unspecified lump in the right breast, lower inner quadrant: Secondary | ICD-10-CM | POA: Diagnosis not present

## 2022-09-17 ENCOUNTER — Encounter: Payer: BC Managed Care – PPO | Admitting: Gastroenterology

## 2022-09-17 DIAGNOSIS — N809 Endometriosis, unspecified: Secondary | ICD-10-CM | POA: Diagnosis not present

## 2022-09-17 DIAGNOSIS — M5459 Other low back pain: Secondary | ICD-10-CM | POA: Diagnosis not present

## 2022-09-17 DIAGNOSIS — F1423 Cocaine dependence with withdrawal: Secondary | ICD-10-CM | POA: Diagnosis not present

## 2022-09-17 DIAGNOSIS — I160 Hypertensive urgency: Secondary | ICD-10-CM | POA: Diagnosis not present

## 2022-09-17 DIAGNOSIS — N3010 Interstitial cystitis (chronic) without hematuria: Secondary | ICD-10-CM | POA: Diagnosis not present

## 2022-09-17 DIAGNOSIS — K5904 Chronic idiopathic constipation: Secondary | ICD-10-CM | POA: Diagnosis not present

## 2022-09-17 DIAGNOSIS — R102 Pelvic and perineal pain: Secondary | ICD-10-CM | POA: Diagnosis not present

## 2022-09-17 DIAGNOSIS — F1120 Opioid dependence, uncomplicated: Secondary | ICD-10-CM | POA: Diagnosis not present

## 2022-09-17 DIAGNOSIS — M542 Cervicalgia: Secondary | ICD-10-CM | POA: Diagnosis not present

## 2022-09-17 DIAGNOSIS — F17210 Nicotine dependence, cigarettes, uncomplicated: Secondary | ICD-10-CM | POA: Diagnosis not present

## 2022-09-17 DIAGNOSIS — K219 Gastro-esophageal reflux disease without esophagitis: Secondary | ICD-10-CM | POA: Diagnosis not present

## 2022-09-17 DIAGNOSIS — F439 Reaction to severe stress, unspecified: Secondary | ICD-10-CM | POA: Diagnosis not present

## 2022-09-17 DIAGNOSIS — F1420 Cocaine dependence, uncomplicated: Secondary | ICD-10-CM | POA: Diagnosis not present

## 2022-09-17 DIAGNOSIS — F39 Unspecified mood [affective] disorder: Secondary | ICD-10-CM | POA: Diagnosis not present

## 2022-09-21 ENCOUNTER — Encounter: Payer: Self-pay | Admitting: Student

## 2022-09-22 DIAGNOSIS — F439 Reaction to severe stress, unspecified: Secondary | ICD-10-CM | POA: Diagnosis not present

## 2022-09-22 DIAGNOSIS — N3010 Interstitial cystitis (chronic) without hematuria: Secondary | ICD-10-CM | POA: Diagnosis not present

## 2022-09-22 DIAGNOSIS — R102 Pelvic and perineal pain: Secondary | ICD-10-CM | POA: Diagnosis not present

## 2022-09-22 DIAGNOSIS — K5904 Chronic idiopathic constipation: Secondary | ICD-10-CM | POA: Diagnosis not present

## 2022-09-22 DIAGNOSIS — F1423 Cocaine dependence with withdrawal: Secondary | ICD-10-CM | POA: Diagnosis not present

## 2022-09-22 DIAGNOSIS — M542 Cervicalgia: Secondary | ICD-10-CM | POA: Diagnosis not present

## 2022-09-22 DIAGNOSIS — N809 Endometriosis, unspecified: Secondary | ICD-10-CM | POA: Diagnosis not present

## 2022-09-22 DIAGNOSIS — K219 Gastro-esophageal reflux disease without esophagitis: Secondary | ICD-10-CM | POA: Diagnosis not present

## 2022-09-22 DIAGNOSIS — F1420 Cocaine dependence, uncomplicated: Secondary | ICD-10-CM | POA: Diagnosis not present

## 2022-09-22 DIAGNOSIS — F1120 Opioid dependence, uncomplicated: Secondary | ICD-10-CM | POA: Diagnosis not present

## 2022-09-22 DIAGNOSIS — I160 Hypertensive urgency: Secondary | ICD-10-CM | POA: Diagnosis not present

## 2022-09-22 DIAGNOSIS — F39 Unspecified mood [affective] disorder: Secondary | ICD-10-CM | POA: Diagnosis not present

## 2022-09-22 DIAGNOSIS — F17210 Nicotine dependence, cigarettes, uncomplicated: Secondary | ICD-10-CM | POA: Diagnosis not present

## 2022-09-22 DIAGNOSIS — M5459 Other low back pain: Secondary | ICD-10-CM | POA: Diagnosis not present

## 2022-10-12 DIAGNOSIS — F1120 Opioid dependence, uncomplicated: Secondary | ICD-10-CM | POA: Diagnosis not present

## 2022-10-18 DIAGNOSIS — F1120 Opioid dependence, uncomplicated: Secondary | ICD-10-CM | POA: Diagnosis not present

## 2022-10-25 DIAGNOSIS — F1120 Opioid dependence, uncomplicated: Secondary | ICD-10-CM | POA: Diagnosis not present

## 2022-11-02 ENCOUNTER — Encounter: Payer: BC Managed Care – PPO | Admitting: Student

## 2022-11-04 DIAGNOSIS — F908 Attention-deficit hyperactivity disorder, other type: Secondary | ICD-10-CM | POA: Diagnosis not present

## 2022-11-04 DIAGNOSIS — F1110 Opioid abuse, uncomplicated: Secondary | ICD-10-CM | POA: Diagnosis not present

## 2022-11-04 DIAGNOSIS — F4323 Adjustment disorder with mixed anxiety and depressed mood: Secondary | ICD-10-CM | POA: Diagnosis not present

## 2022-11-04 DIAGNOSIS — F3162 Bipolar disorder, current episode mixed, moderate: Secondary | ICD-10-CM | POA: Diagnosis not present

## 2022-11-11 ENCOUNTER — Encounter: Payer: BC Managed Care – PPO | Admitting: Student

## 2022-11-19 ENCOUNTER — Telehealth: Payer: Self-pay

## 2022-11-19 NOTE — Telephone Encounter (Signed)
No show for Pre-Visit. Patient was called four times, no answer, always a busy signal so I was not able to leave a message to call and reschedule her Pre-Visit. If patient does not call before 5 today a no show letter will be mailed to her home.

## 2022-11-19 NOTE — Telephone Encounter (Signed)
Dr. Barron Alvine,  Just an FYI. Cherlynn June did not show up for PV. She is scheduled for [facility] on 01-25-23 for an endo and colon. I was unable to leave her a message to call and reschedule because the line was always busy. A message was sent via My Chart to call and reschedule as well as a no show letter mailed. Mick Sell is aware since she is scheduled for the hospital.   Janalee Dane, LPN ( Pre-Visit )

## 2022-11-24 NOTE — Telephone Encounter (Signed)
Unable to reach the patient. Multiple attempts made. LVM to patient husband Mr. Joy Nelson phone for the patient to return the call to our office. Pre-Visit Nurse Harriett Sine has already tried to call the patient multiple times and letter has been mailed. MyChart message sent as well.

## 2022-12-01 ENCOUNTER — Ambulatory Visit (HOSPITAL_COMMUNITY): Payer: BC Managed Care – PPO

## 2022-12-01 ENCOUNTER — Encounter: Payer: BC Managed Care – PPO | Admitting: Student

## 2022-12-10 NOTE — Telephone Encounter (Signed)
Attempted to contact pt.unable to reach pt or leave a message.

## 2022-12-13 NOTE — Telephone Encounter (Signed)
Joy Nelson, This patient has failed to call back to the office or respond by MyChart messaging concerning her [facility] procedure scheduled with Dr.Cirigliano on 01/25/2023.  She did not/has not completed her PV appt.  Will you please call her and try to get her rescheduled for a PV, and if not able to contact her, then will you cancel her [facility] procedure please? Thank you Bre

## 2022-12-13 NOTE — Telephone Encounter (Signed)
Attempted to reach patient by phone. Number rings then says "number not in service at this time." Will send letter to patient's home address and via mychart as well.

## 2022-12-22 DIAGNOSIS — F1120 Opioid dependence, uncomplicated: Secondary | ICD-10-CM | POA: Diagnosis not present

## 2023-01-05 ENCOUNTER — Ambulatory Visit (HOSPITAL_COMMUNITY): Payer: BC Managed Care – PPO

## 2023-01-12 ENCOUNTER — Telehealth: Payer: Self-pay | Admitting: *Deleted

## 2023-01-12 NOTE — Telephone Encounter (Signed)
Patient called to reschedule endoscopy/colonoscopy at the hospital (previously was cancelled due to no show at previsit and no response from mychart messages).   I have reiterated to patient that she must answer her phone and complete previsit in order to go forward with her procedure. She verbalizes understanding and has scheduled previsit for 02/22/23 at 2:30 pm. She is also reminded she will need a care partner, 18 years or older to bring her, stay for her procedure, and drive her home due to sedation.  She has scheduled endoscopy/colonoscopy for 10:45 am (9:15 am arrival) 03/08/23 at [facility] with Dr Barron Alvine.

## 2023-01-17 ENCOUNTER — Ambulatory Visit (HOSPITAL_COMMUNITY)
Admission: RE | Admit: 2023-01-17 | Discharge: 2023-01-17 | Disposition: A | Discharge status: Home / Self Care | Payer: BC Managed Care – PPO | Source: Ambulatory Visit | Attending: Family Medicine | Admitting: Family Medicine

## 2023-01-17 ENCOUNTER — Ambulatory Visit (INDEPENDENT_AMBULATORY_CARE_PROVIDER_SITE_OTHER): Payer: BC Managed Care – PPO

## 2023-01-17 ENCOUNTER — Encounter (HOSPITAL_COMMUNITY): Payer: Self-pay

## 2023-01-17 VITALS — BP 177/104 | HR 90 | Temp 98.2°F | Resp 16

## 2023-01-17 DIAGNOSIS — R0781 Pleurodynia: Secondary | ICD-10-CM

## 2023-01-17 DIAGNOSIS — M25561 Pain in right knee: Secondary | ICD-10-CM | POA: Diagnosis not present

## 2023-01-17 DIAGNOSIS — T148XXA Other injury of unspecified body region, initial encounter: Secondary | ICD-10-CM | POA: Diagnosis not present

## 2023-01-17 MED ORDER — DOXYCYCLINE HYCLATE 100 MG PO CAPS: 100.0000 mg | ORAL_CAPSULE | Freq: Two times a day (BID) | ORAL | 0 refills | Status: DC

## 2023-01-17 MED ORDER — IBUPROFEN 800 MG PO TABS: 800.0000 mg | ORAL_TABLET | Freq: Three times a day (TID) | ORAL | 0 refills | Status: DC

## 2023-01-17 NOTE — ED Triage Notes (Addendum)
Pt reports fell Saturday when tripped over drawer that was out of stove. Pt c/o right knee pain, abd pain/swelling on left side, and hit her head, believes had LOC for about 5 minutes. Reports also having a Lupus flare up and been very fatigued. Pt reports takes Methadone and was dosed earlier that day.   Pt c/o headache at this time. Reports took tylenol for pain.   Pt also wanting areas on her right forearm that haven't healed (months ongoing and seen for them before). Was seen for them before. Pt repots when "white stuff comes up I will pick and then will bleed a lot". Reports itching.

## 2023-01-17 NOTE — ED Notes (Signed)
Provided an updated copy of discharge instructions to include name of both scripts sent to pharmacy

## 2023-01-17 NOTE — ED Notes (Signed)
On discharge, patient questioned an antibiotic--spoke to dr hagler, antibiotic being called in now

## 2023-01-19 NOTE — ED Provider Notes (Signed)
[facility]   [id number] 01/17/23 Arrival Time: 1044  ASSESSMENT & PLAN:  1. Acute pain of right knee   2. Rib pain on left side   3. Skin abrasion    I have personally viewed and independently interpreted the imaging studies ordered this visit. L RIBS: no acute changes appreciated. R KNEE: no acute changes appreciated.  Without resp distress. Activities as tolerated.  Meds ordered this encounter  Medications   ibuprofen (ADVIL) 800 MG tablet    Sig: Take 1 tablet (800 mg total) by mouth 3 (three) times daily with meals.    Dispense:  21 tablet    Refill:  0   doxycycline (VIBRAMYCIN) 100 MG capsule    Sig: Take 1 capsule (100 mg total) by mouth 2 (two) times daily.    Dispense:  14 capsule    Refill:  0   Doxy to cover skin wounds on R forearm; out of caution.  Orders Placed This Encounter  Procedures   DG Ribs Unilateral W/Chest Left   DG Knee Complete 4 Views Right   Work/school excuse note: provided. Recommend:  Follow-up Information     Philomena Doheny, MD.   Specialty: Internal Medicine Why: If worsening or failing to improve as anticipated. Contact information: [address] [place] [address] [phone]                 Reviewed expectations re: course of current medical issues. Questions answered. Outlined signs and symptoms indicating need for more acute intervention. Patient verbalized understanding. After Visit Summary given.  SUBJECTIVE: History from: patient. Joy Nelson is a 47 y.o. female who reports that she fell Saturday when tripped over drawer that was out of stove. Pt c/o right knee pain, abd pain/swelling on left side, and hit her head, believes had LOC for about 5 minutes. Reports also having a Lupus flare up and been very fatigued. Pt reports takes Methadone and was dosed earlier that day. Reports took tylenol for pain.   Pt also wanting areas on her right forearm that haven't healed (months ongoing  and seen for them before). Was seen for them before. Pt repots when "white stuff comes up I will pick and then will bleed a lot". Reports itching.    Past Surgical History:  Procedure Laterality Date   APPENDECTOMY     BRAIN SURGERY  02/2014   pipeline stent   CHOLECYSTECTOMY     I & D EXTREMITY Left 04/22/2016   Procedure: IRRIGATION AND DEBRIDEMENT LEFT UPPER ARM POSSIBLE ABCESS;  Surgeon: Luretha Murphy, MD;  Location: [facility];  Service: General;  Laterality: Left;  Wound left open   TONSILLECTOMY     TUBAL LIGATION        OBJECTIVE:  Vitals:   01/17/23 1155  BP: (!) 177/104  Pulse: 90  Resp: 16  Temp: 98.2 F (36.8 C)  TempSrc: Oral  SpO2: 98%    General appearance: alert; no distress HEENT: [place]; AT Neck: supple with FROM Resp: unlabored respirations Chest Wall: TTP over L anterior lower ribs; no bruising or gross deformities Extremities: RUE: warm with well perfused appearance; multiple scabs and and skin abrasions; some with surrounding erythema; no drainage/bleeding CV: brisk extremity capillary refill of all extremities; Skin: warm and dry; no visible rashes Neurologic: gait normal; normal sensation and strength of all extremities Psychological: alert and cooperative; normal mood and affect  Imaging: DG Ribs Unilateral W/Chest Left  Result Date: 01/17/2023 CLINICAL DATA:  Left rib  pain after fall. EXAM: LEFT RIBS AND CHEST - 3+ VIEW COMPARISON:  11/16/2022. FINDINGS: No fracture or other bone lesions are seen involving the ribs. There is no evidence of pneumothorax or pleural effusion. Both lungs are clear. Heart size and mediastinal contours are within normal limits. IMPRESSION: Negative. Electronically Signed   By: Lupita Raider M.D.   On: 01/17/2023 15:04   DG Knee Complete 4 Views Right  Result Date: 01/17/2023 CLINICAL DATA:  Right knee pain after fall. EXAM: RIGHT KNEE - COMPLETE 4+ VIEW COMPARISON:  None Available. FINDINGS: No evidence of fracture,  dislocation, or joint effusion. No evidence of arthropathy or other focal bone abnormality. Soft tissues are unremarkable. IMPRESSION: Negative. Electronically Signed   By: Lupita Raider M.D.   On: 01/17/2023 15:02       Allergies  Allergen Reactions   Methotrexate Derivatives Other (See Comments)    Flushing    Celecoxib Swelling   Morphine And Codeine Itching   Penicillins Other (See Comments)    Childhood reaction.  Turns blue and has convulsions.   Sulfa Antibiotics Swelling   Sulfamethoxazole-Trimethoprim     Other Reaction(s): GI Intolerance, Not available   Tilactase     Other Reaction(s): Not available   Other Rash    Imitrex   Sumatriptan Palpitations    Other Reaction(s): Not available   Tape Rash    Can have paper tape.     Past Medical History:  Diagnosis Date   ADD (attention deficit disorder)    Anxiety    Back pain    Cerebral aneurysm    Chiari malformation    Depression    Fibromyalgia    GERD (gastroesophageal reflux disease)    Hypertension    Hypoglycemia    Interstitial cystitis    Kidney stones    Lupus    Lupus    Migraine    Osteoarthritis    Raynaud disease    Seasonal allergies    Sjogren's disease (HCC)    Social History   Socioeconomic History   Marital status: Legally Separated    Spouse name: Not on file   Number of children: 1   Years of education: Some College   Highest education level: Not on file  Occupational History   Occupation: Unemployment    Comment: Seeking disability  Tobacco Use   Smoking status: Every Day    Current packs/day: 0.25    Types: Cigarettes   Smokeless tobacco: Never   Tobacco comments:    01/21/16 one pack every 2.5 days  Vaping Use   Vaping status: Never Used  Substance and Sexual Activity   Alcohol use: Yes    Comment: Socially - maybe once monthly   Drug use: Yes    Frequency: 7.0 times per week    Types: Marijuana    Comment: heroin   Sexual activity: Yes    Birth  control/protection: Surgical  Other Topics Concern   Not on file  Social History Narrative   Currently lives at [employer] but is hopeful to have private housing soon.   Right-handed.   Occasional caffeine use.   Social Determinants of Health   Financial Resource Strain: Not on file  Food Insecurity: Not on file  Transportation Needs: Not on file  Physical Activity: Not on file  Stress: Not on file  Social Connections: Not on file   Family History  Problem Relation Age of Onset   Hypertension Mother    Stroke Mother  Migraines Mother    Skin cancer Mother    Heart disease Mother    Hyperlipidemia Mother    Arthritis Mother    Hypertension Father    Migraines Father    Heart disease Father    Stroke Father    Aneurysm Father    Skin cancer Maternal Grandmother    Arthritis Maternal Grandfather    Colon cancer Maternal Grandfather    Breast cancer Paternal Grandmother    Diabetes Paternal Grandmother    Liver cancer Neg Hx    Esophageal cancer Neg Hx    Past Surgical History:  Procedure Laterality Date   APPENDECTOMY     BRAIN SURGERY  02/2014   pipeline stent   CHOLECYSTECTOMY     I & D EXTREMITY Left 04/22/2016   Procedure: IRRIGATION AND DEBRIDEMENT LEFT UPPER ARM POSSIBLE ABCESS;  Surgeon: Luretha Murphy, MD;  Location: [facility];  Service: General;  Laterality: Left;  Wound left open   TONSILLECTOMY     TUBAL LIGATION         Mardella Layman, MD 01/19/23 [phone]

## 2023-01-25 ENCOUNTER — Ambulatory Visit (HOSPITAL_COMMUNITY): Admit: 2023-01-25 | Payer: BC Managed Care – PPO | Admitting: Gastroenterology

## 2023-01-25 ENCOUNTER — Encounter (HOSPITAL_COMMUNITY): Payer: Self-pay

## 2023-01-25 SURGERY — COLONOSCOPY WITH PROPOFOL
Anesthesia: Monitor Anesthesia Care

## 2023-01-26 ENCOUNTER — Telehealth: Payer: Self-pay | Admitting: Student

## 2023-01-26 NOTE — Telephone Encounter (Signed)
Pt seen at Urgent Care on 01/17/2023 and is not doing any better. Pt does not won;t to go back to the Urgent and wants to be seen. Pt aware we are booked today.  Pt also complaining that she may have a a UTi as well.  Please call the patient back.

## 2023-01-26 NOTE — Telephone Encounter (Signed)
Return pt's call. Stated she went to UC on 10/21 b/c of a fall - hurt her knee/rib;X-rays were done. Also c/o "spots" on her right arm; comes and goes over 6 month period; red/itches - stated she was prescribed an abx. Also stated she may have an UTI - burning, frequency. Hx Lupus and Raynaud's disease. Stated she's not feeing better.  No available appts today nor the rest of the week at this time. Pt stated she will go back to UC if she has to but prefers to be seen here.

## 2023-01-26 NOTE — Telephone Encounter (Signed)
Appt schedule tomoorow @ 1415 PM with Dr Nooruddin.

## 2023-01-27 ENCOUNTER — Ambulatory Visit: Payer: MEDICAID | Admitting: Student

## 2023-01-27 VITALS — BP 136/75 | HR 66 | Temp 98.2°F | Ht 61.0 in | Wt 118.4 lb

## 2023-01-27 DIAGNOSIS — R21 Rash and other nonspecific skin eruption: Secondary | ICD-10-CM

## 2023-01-27 DIAGNOSIS — R300 Dysuria: Secondary | ICD-10-CM | POA: Diagnosis not present

## 2023-01-27 LAB — POCT URINALYSIS DIPSTICK
Bilirubin, UA: NEGATIVE
Glucose, UA: NEGATIVE
Ketones, UA: NEGATIVE
Nitrite, UA: NEGATIVE
Protein, UA: POSITIVE — AB
Spec Grav, UA: >=1.03 — AB (ref 1.010–1.025)
Urobilinogen, UA: 0.2 U/dL
pH, UA: 6 (ref 5.0–8.0)

## 2023-01-27 MED ORDER — LIDOCAINE 4 % EX PTCH: 1.0000 | MEDICATED_PATCH | CUTANEOUS | 0 refills | Status: AC

## 2023-01-27 MED ORDER — CIPROFLOXACIN HCL 500 MG PO TABS: 500.0000 mg | ORAL_TABLET | Freq: Two times a day (BID) | ORAL | 0 refills | Status: AC

## 2023-01-27 NOTE — Patient Instructions (Addendum)
Thank you so much for coming to the clinic today!   For your pain when you pee and also your back pain, I think you might have another kidney function.  I am sending in a week course of antibiotics.  I am also sending in a cane patches.  If you start vomiting or throwing up or your symptoms become worse like you are having a fever, please go to the emergency department or give [place] a call.  If you have any questions please feel free to the call the clinic at anytime at [phone]. It was a pleasure seeing you!  Best, Dr. Thomasene Ripple

## 2023-01-29 DIAGNOSIS — R21 Rash and other nonspecific skin eruption: Secondary | ICD-10-CM | POA: Insufficient documentation

## 2023-01-29 DIAGNOSIS — R300 Dysuria: Secondary | ICD-10-CM | POA: Insufficient documentation

## 2023-01-29 NOTE — Progress Notes (Signed)
CC: Back pain, dysuria  HPI:  [name] is a 47 y.o. female living with a history stated below and presents today for back pain. Please see problem based assessment and plan for additional details.  Past Medical History:  Diagnosis Date   ADD (attention deficit disorder)    Anxiety    Back pain    Cerebral aneurysm    Chiari malformation    Depression    Fibromyalgia    GERD (gastroesophageal reflux disease)    Hypertension    Hypoglycemia    Interstitial cystitis    Kidney stones    Lupus    Lupus    Migraine    Osteoarthritis    Raynaud disease    Seasonal allergies    Sjogren's disease (HCC)     Current Outpatient Medications on File Prior to Visit  Medication Sig Dispense Refill   amantadine (SYMMETREL) 100 MG capsule Take 100 mg by mouth 2 (two) times daily.     amphetamine-dextroamphetamine (ADDERALL XR) 30 MG 24 hr capsule Take 30 mg by mouth daily.     aspirin EC 81 MG tablet Take 1 tablet by mouth daily. (Patient not taking: Reported on 08/16/2022)     busPIRone (BUSPAR) 10 MG tablet Take 10 mg by mouth 3 (three) times daily.     cetirizine (ZYRTEC ALLERGY) 10 MG tablet Take 1 tablet (10 mg total) by mouth daily. (Patient not taking: Reported on 08/16/2022) 90 tablet 0   clonazePAM (KLONOPIN) 0.5 MG tablet Take 0.5 mg by mouth 2 (two) times daily as needed for anxiety. (Patient not taking: Reported on 08/16/2022)     escitalopram (LEXAPRO) 10 MG tablet Take 10 mg by mouth daily.     gabapentin (NEURONTIN) 300 MG capsule Take 300 mg by mouth 3 (three) times daily. (Patient not taking: Reported on 08/16/2022)     hydrochlorothiazide (HYDRODIURIL) 12.5 MG tablet Take 1 tablet (12.5 mg total) by mouth daily. 90 tablet 0   lamoTRIgine (LAMICTAL) 200 MG tablet Take 200 mg by mouth daily.     lurasidone (LATUDA) 40 MG TABS tablet Take 40 mg by mouth daily with breakfast.     methadone (DOLOPHINE) 10 MG/ML solution Take 20 mg by mouth every 8 (eight) hours.     No  current facility-administered medications on file prior to visit.    Family History  Problem Relation Age of Onset   Hypertension Mother    Stroke Mother    Migraines Mother    Skin cancer Mother    Heart disease Mother    Hyperlipidemia Mother    Arthritis Mother    Hypertension Father    Migraines Father    Heart disease Father    Stroke Father    Aneurysm Father    Skin cancer Maternal Grandmother    Arthritis Maternal Grandfather    Colon cancer Maternal Grandfather    Breast cancer Paternal Grandmother    Diabetes Paternal Grandmother    Liver cancer Neg Hx    Esophageal cancer Neg Hx     Social History   Socioeconomic History   Marital status: Legally Separated    Spouse name: Not on file   Number of children: 1   Years of education: Some College   Highest education level: Not on file  Occupational History   Occupation: Unemployment    Comment: Seeking disability  Tobacco Use   Smoking status: Every Day    Current packs/day: 0.25    Types: Cigarettes  Smokeless tobacco: Never   Tobacco comments:    01/21/16 one pack every 2.5 days  Vaping Use   Vaping status: Never Used  Substance and Sexual Activity   Alcohol use: Yes    Comment: Socially - maybe once monthly   Drug use: Yes    Frequency: 7.0 times per week    Types: Marijuana    Comment: heroin   Sexual activity: Yes    Birth control/protection: Surgical  Other Topics Concern   Not on file  Social History Narrative   Currently lives at [employer] but is hopeful to have private housing soon.   Right-handed.   Occasional caffeine use.   Social Determinants of Health   Financial Resource Strain: Not on file  Food Insecurity: Not on file  Transportation Needs: Not on file  Physical Activity: Not on file  Stress: Not on file  Social Connections: Not on file  Intimate Partner Violence: Not on file    Review of Systems: ROS negative except for what is noted on the assessment and  plan.  Vitals:   01/27/23 1445  BP: 136/75  Pulse: 66  Temp: 98.2 F (36.8 C)  TempSrc: Oral  SpO2: 96%  Weight: 118 lb 6.4 oz (53.7 kg)  Height: 5\' 1"  (1.549 m)    Physical Exam: Constitutional: Chronically ill appearing female,  in no acute distress HENT: normocephalic atraumatic, mucous membranes moist Eyes: conjunctiva non-erythematous Neck: supple Cardiovascular: regular rate and rhythm, no m/r/g Pulmonary/Chest: normal work of breathing on room air, lungs clear to auscultation bilaterally Neurological: alert & oriented x 3, 5/5 strength in bilateral upper and lower extremities, normal gait Skin: warm and dry, severe left flank tenderness, multiple open blisters on arms, chest, and back   Assessment & Plan:   Dysuria Pt states that she has a history of interstitial cystitis. She states the dysuria started yesterday, however has been having intermittent fevers and chills since Monday. She also endorses left sided flank pain that started yesterday as well. She currently is sexually active, but states her partner is clean. She is able to eat and drink. Overall, given presentation of symptoms, I am concerned for pyelonephritis.   As she is able to eat, drink, and not nauseas/vomiting, she does not meet inpatient criteria. Will treat with a 7 day course of ciprofloxacin. Urine tested in the clinic also showed sterile pyuria, which makes diagnosis of pyelonephritis more likely. I have instructed patient that if her symptoms get worse, and if she can't eat or starts vomiting, to present to the emergency department.   Plan:  -  Ciprofloxacin 500mg  BID  - Return precautions given  Acute skin eruption of discoloration, elevations, blisters Pt states that she has intermittently had skin blisters mostly on her arms, she has never had this worked up before. She states for the last 6 months or so they have been getting worse in terms of pain and itchiness. On exam she has multiple open  blisters on her arms bilaterally, chest, and back. She does have a history of Lupus and Raynauds, however this presentation is unlikely consistent with both of these diagnoses. She will need a punch biopsy at her next visit, but given her acute symptoms for pyelonephritis, will hold off now.   Plan:  - Skin biopsy at next visit  Patient discussed with Dr. Lynford Humphrey, M.D. [facility] Internal Medicine, PGY-2 Pager: [phone] Date 01/29/2023 Time 7:10 PM

## 2023-01-29 NOTE — Assessment & Plan Note (Signed)
Pt states that she has a history of interstitial cystitis. She states the dysuria started yesterday, however has been having intermittent fevers and chills since Monday. She also endorses left sided flank pain that started yesterday as well. She currently is sexually active, but states her partner is clean. She is able to eat and drink. Overall, given presentation of symptoms, I am concerned for pyelonephritis.   As she is able to eat, drink, and not nauseas/vomiting, she does not meet inpatient criteria. Will treat with a 7 day course of ciprofloxacin. Urine tested in the clinic also showed sterile pyuria, which makes diagnosis of pyelonephritis more likely. I have instructed patient that if her symptoms get worse, and if she can't eat or starts vomiting, to present to the emergency department.   Plan:  -  Ciprofloxacin 500mg  BID  - Return precautions given

## 2023-01-29 NOTE — Assessment & Plan Note (Signed)
Pt states that she has intermittently had skin blisters mostly on her arms, she has never had this worked up before. She states for the last 6 months or so they have been getting worse in terms of pain and itchiness. On exam she has multiple open blisters on her arms bilaterally, chest, and back. She does have a history of Lupus and Raynauds, however this presentation is unlikely consistent with both of these diagnoses. She will need a punch biopsy at her next visit, but given her acute symptoms for pyelonephritis, will hold off now.   Plan:  - Skin biopsy at next visit

## 2023-01-30 LAB — UA/M W/RFLX CULTURE, COMP
Bilirubin, UA: NEGATIVE
Glucose, UA: NEGATIVE
Ketones, UA: NEGATIVE
Nitrite, UA: NEGATIVE
Specific Gravity, UA: 1.021 (ref 1.005–1.030)
Urobilinogen, Ur: 0.2 mg/dL (ref 0.2–1.0)
pH, UA: 6 (ref 5.0–7.5)

## 2023-01-30 LAB — MICROSCOPIC EXAMINATION
Bacteria, UA: NONE SEEN
Casts: NONE SEEN /LPF
RBC, Urine: NONE SEEN /HPF (ref 0–2)

## 2023-01-30 LAB — URINE CULTURE, COMPREHENSIVE

## 2023-01-31 MED ORDER — CEPHALEXIN 500 MG PO CAPS: 500.0000 mg | ORAL_CAPSULE | Freq: Two times a day (BID) | ORAL | 0 refills | Status: AC

## 2023-01-31 NOTE — Addendum Note (Signed)
Addended by: Olegario Messier on: 01/31/2023 09:03 AM   Modules accepted: Orders

## 2023-02-02 NOTE — Progress Notes (Signed)
Internal Medicine Clinic Attending  Case discussed with the resident at the time of the visit.  We reviewed the resident's history and exam and pertinent patient test results.  I agree with the assessment, diagnosis, and plan of care documented in the resident's note.  

## 2023-02-10 ENCOUNTER — Telehealth: Payer: Self-pay | Admitting: Gastroenterology

## 2023-02-10 NOTE — Telephone Encounter (Signed)
PT is calling to find out if there are any earlier appointment for her procedure. Please advise.

## 2023-02-10 NOTE — Telephone Encounter (Signed)
Patient advised that unfortunately, we have no sooner availabilities to complete her hospital procedure than 03/08/23 for which she is already scheduled. I also have no availabilities for previsit dates until 03/08/23. She is currently set for 02/22/23 previsit. Patient states that she will keep scheduled appointments at this time. States she just wanted to see about earlier dates because she  "may have to go to [facility]" closer to 11/21 or 11/22.

## 2023-02-22 ENCOUNTER — Telehealth: Payer: Self-pay

## 2023-02-22 NOTE — Telephone Encounter (Signed)
Opened in error. SChaplin, RN,BSN

## 2023-02-22 NOTE — Telephone Encounter (Signed)
Endoscopy/colonoscopy previously scheduled at [facility] for 03/08/23 has been cancelled at this time.

## 2023-02-22 NOTE — Telephone Encounter (Signed)
Attempted to call patient for previsit telephone appt.  Called 3 times over 15 minutes, VM box is full and RN unable to leave message.  If patient does not call back before 1700, previsit and endo/colon will be rescheduled.  A letter will be mailed to patient notifying her of this. SChaplin, RN,BSN

## 2023-02-22 NOTE — Telephone Encounter (Signed)
Patient did not call back to reschedule previsit so PV and endo/colon were cancelled.  Letter sent via mychart and mail. SChaplin, RN,BSN

## 2023-03-01 NOTE — Telephone Encounter (Signed)
Discharge letter has been created and is awaiting Dr Frankey Shown signature.

## 2023-03-01 NOTE — Telephone Encounter (Signed)
===  View-only below this line=== ----- Message ----- From: Shellia Cleverly, DO Sent: 02/28/2023   1:19 PM EST To: Richardson Chiquito, RN  As this is the second no-show for hospital-based procedures, out of fairness to our other patients who have been waiting months for those limited endoscopy spaces, I recommend releasing her from GI care with [place].  Thank you.

## 2023-03-07 ENCOUNTER — Telehealth: Payer: Self-pay | Admitting: Gastroenterology

## 2023-03-07 NOTE — Telephone Encounter (Signed)
Patient dismissed, Dr. Ortencia Kick find it necessary to inform you that he will no longer be able to provide medical care to you. 03/01/23

## 2023-03-08 ENCOUNTER — Encounter (HOSPITAL_COMMUNITY): Payer: Self-pay

## 2023-03-08 ENCOUNTER — Ambulatory Visit (HOSPITAL_COMMUNITY): Admit: 2023-03-08 | Payer: MEDICAID | Admitting: Gastroenterology

## 2023-03-08 ENCOUNTER — Telehealth: Payer: Self-pay | Admitting: Gastroenterology

## 2023-03-08 SURGERY — COLONOSCOPY WITH PROPOFOL
Anesthesia: Monitor Anesthesia Care
